# Patient Record
Sex: Female | Born: 1965 | Race: White | Hispanic: No | Marital: Married | Smoking: Former smoker
Health system: Southern US, Community
[De-identification: ages and names within clinical notes are randomized; demographics above are authoritative.]

## PROBLEM LIST (undated history)

## (undated) DIAGNOSIS — E559 Vitamin D deficiency, unspecified: Secondary | ICD-10-CM

## (undated) DIAGNOSIS — F909 Attention-deficit hyperactivity disorder, unspecified type: Secondary | ICD-10-CM

## (undated) DIAGNOSIS — M5030 Other cervical disc degeneration, unspecified cervical region: Secondary | ICD-10-CM

## (undated) DIAGNOSIS — Z87898 Personal history of other specified conditions: Secondary | ICD-10-CM

## (undated) DIAGNOSIS — G43909 Migraine, unspecified, not intractable, without status migrainosus: Secondary | ICD-10-CM

## (undated) DIAGNOSIS — S29019A Strain of muscle and tendon of unspecified wall of thorax, initial encounter: Secondary | ICD-10-CM

## (undated) DIAGNOSIS — Z8659 Personal history of other mental and behavioral disorders: Secondary | ICD-10-CM

## (undated) DIAGNOSIS — F411 Generalized anxiety disorder: Secondary | ICD-10-CM

## (undated) DIAGNOSIS — E0590 Thyrotoxicosis, unspecified without thyrotoxic crisis or storm: Secondary | ICD-10-CM

## (undated) DIAGNOSIS — M7918 Myalgia, other site: Secondary | ICD-10-CM

## (undated) HISTORY — DX: Migraine, unspecified, not intractable, without status migrainosus: G43.909

## (undated) HISTORY — DX: Myalgia, other site: M79.18

## (undated) HISTORY — PX: OTHER SURGICAL HISTORY: SHX169

## (undated) HISTORY — DX: Personal history of other mental and behavioral disorders: Z86.59

## (undated) HISTORY — DX: Generalized anxiety disorder: F41.1

## (undated) HISTORY — DX: Thyrotoxicosis, unspecified without thyrotoxic crisis or storm: E05.90

## (undated) HISTORY — DX: Vitamin D deficiency, unspecified: E55.9

## (undated) HISTORY — DX: Personal history of other specified conditions: Z87.898

## (undated) HISTORY — DX: Strain of muscle and tendon of unspecified wall of thorax, initial encounter: S29.019A

## (undated) HISTORY — DX: Attention-deficit hyperactivity disorder, unspecified type: F90.9

---
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
## 2001-06-07 ENCOUNTER — Other Ambulatory Visit: Admission: RE | Admit: 2001-06-07 | Discharge: 2001-06-07 | Payer: Self-pay | Admitting: *Deleted

## 2002-06-10 ENCOUNTER — Other Ambulatory Visit: Admission: RE | Admit: 2002-06-10 | Discharge: 2002-06-10 | Payer: Self-pay | Admitting: Obstetrics & Gynecology

## 2002-08-10 ENCOUNTER — Other Ambulatory Visit: Admission: RE | Admit: 2002-08-10 | Discharge: 2002-08-10 | Payer: Self-pay | Admitting: Obstetrics & Gynecology

## 2003-03-24 ENCOUNTER — Other Ambulatory Visit: Admission: RE | Admit: 2003-03-24 | Discharge: 2003-03-24 | Payer: Self-pay | Admitting: Obstetrics & Gynecology

## 2003-06-23 ENCOUNTER — Other Ambulatory Visit: Admission: RE | Admit: 2003-06-23 | Discharge: 2003-06-23 | Payer: Self-pay | Admitting: Obstetrics & Gynecology

## 2005-01-23 ENCOUNTER — Ambulatory Visit (HOSPITAL_COMMUNITY): Admission: RE | Admit: 2005-01-23 | Discharge: 2005-01-23 | Payer: Self-pay | Admitting: Obstetrics & Gynecology

## 2008-02-11 DIAGNOSIS — E0590 Thyrotoxicosis, unspecified without thyrotoxic crisis or storm: Secondary | ICD-10-CM

## 2008-02-11 HISTORY — DX: Thyrotoxicosis, unspecified without thyrotoxic crisis or storm: E05.90

## 2008-11-10 DIAGNOSIS — M1990 Unspecified osteoarthritis, unspecified site: Secondary | ICD-10-CM | POA: Insufficient documentation

## 2009-07-11 ENCOUNTER — Ambulatory Visit: Payer: Self-pay | Admitting: Pulmonary Disease

## 2009-07-11 DIAGNOSIS — G4710 Hypersomnia, unspecified: Secondary | ICD-10-CM | POA: Insufficient documentation

## 2009-07-11 DIAGNOSIS — G43909 Migraine, unspecified, not intractable, without status migrainosus: Secondary | ICD-10-CM | POA: Insufficient documentation

## 2010-01-30 ENCOUNTER — Ambulatory Visit (HOSPITAL_COMMUNITY)
Admission: RE | Admit: 2010-01-30 | Discharge: 2010-01-30 | Payer: Self-pay | Source: Home / Self Care | Attending: Obstetrics & Gynecology | Admitting: Obstetrics & Gynecology

## 2010-03-12 NOTE — Assessment & Plan Note (Signed)
Summary: ? sleep disorder/apc   Visit Type:  Initial Consult Copy to:  Dr. Candie Echevaria  CC:  Sleep consult. Epworth score is 12.Marland Kitchen  History of Present Illness: 45 yo female with sleep problems.  She has noticed a problem with her sleep since she was a teenager.  She has trouble staying awake during the day.  She does not have any problem falling asleep or staying asleep.  She can usually keep herself awake during the day when she is active, but can fall asleep quickly when sitting quiet.  She goes to bed at 10pm, and sleeps through the night.  She wakes up at 730am, and will occasionally get headaches in the morning.  She feels sleepy and tired alll the time.  She does not use anything to help her sleep.  She has tried using energy drinks during the day, and these help some.  She occasionally snores. 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  She will also have frequent dreams.  She will occasionally talk in her sleep.  She denies sleep walking.  She will get funny feeling in her legs about once every few weeks, but this does not usually cause too much trouble falling asleep.  Her husband has told her that she kicks her legs alot at night, and sleeps in funny positions.  She denies sleep hallucinations, sleep paralysis, or cataplexy.  She occasionally gets down with her mood, and has recently been on celexa and cymbalta.  She is not on these medications at present, and feels her mood is good.  She has started an exercise regimen, and has lost 10 lbs.  She has not noticed any difference with her sleep from her weight loss.  She denies any history of thyroid disease.  Preventive Screening-Counseling & Management  Alcohol-Tobacco     Alcohol drinks/day: <1     Smoking Status: quit     Year Quit: 2008     Pack years: 25 years x1 ppd  Current Medications (verified): 1)  Zomig 5 Mg Tabs (Zolmitriptan) .... 1/2 By Mouth As Needed For  Migraines  Allergies (verified): No Known Drug Allergies  Past History:  Past Medical History: Current Problems:  MIGRAINES, HX OF (ICD-V13.8)  Past Surgical History: no surgical history  Family History: Reviewed history and no changes required. No known significant family history  Social History: Reviewed history and no changes required. Patient states former smoker.  Married Psychologist, sport and exercise for Doctor, hospital drinks/day:  <1 Smoking Status:  quit Pack years:  25 years x1 ppd  Review of Systems  The patient denies shortness of breath with activity, shortness of breath at rest, productive cough, non-productive cough, coughing up blood, chest pain, irregular heartbeats, acid heartburn, indigestion, loss of appetite, weight change, abdominal pain, difficulty swallowing, sore throat, tooth/dental problems, headaches, nasal congestion/difficulty breathing through nose, sneezing, itching, ear ache, anxiety, depression, hand/feet swelling, joint stiffness or pain, rash, change in color of mucus, and fever.    Vital Signs:  Patient profile:   45 year old female Height:      67.5 inches (171.45 cm) Weight:      166 pounds (75.45 kg) BMI:     25.71 O2 Sat:      99 % on Room air Temp:     98.6 degrees F (37.00 degrees C) oral Pulse rate:   75 / minute BP sitting:   98 / 58  (left  arm) Cuff size:   regular  Vitals Entered By: Michel Bickers CMA (July 11, 2009 10:13 AM)  O2 Sat at Rest %:  99 O2 Flow:  Room air CC: Sleep consult. Epworth score is 12. Is Patient Diabetic? No Comments Medications reviewed. Daytime phone verified. Michel Bickers CMA  July 11, 2009 10:14 AM   Physical Exam  General:  normal appearance and healthy appearing.   Eyes:  PERRLA and EOMI.   Nose:  no deformity, discharge, inflammation, or lesions Mouth:  MP4, scalloped tongue borders, elongated uvula, retrognathic Neck:  no JVD.   Chest Wall:  no deformities noted Lungs:  clear bilaterally to  auscultation and percussion Heart:  regular rate and rhythm, S1, S2 without murmurs, rubs, gallops, or clicks Abdomen:  bowel sounds positive; abdomen soft and non-tender without masses, or organomegaly Msk:  no deformity or scoliosis noted with normal posture Pulses:  pulses normal Extremities:  no clubbing, cyanosis, edema, or deformity noted Neurologic:  CN II-XII grossly intact with normal reflexes, coordination, muscle strength and tone Cervical Nodes:  no significant adenopathy Psych:  alert and cooperative; normal mood and affect; normal attention span and concentration   Impression & Recommendations:  Problem # 1:  HYPERSOMNIA (ICD-780.54) She has symptoms and physical finding suggestive of sleep apnea.  She also has symptoms suggestive of restless leg syndrome and periodic limb movements.  Her symptoms are not very suggestive of narcolepsy.    To further evaluate sleep apnea and periodic limb movements I will arrange for an in-lab sleep test.  Depending on the results of this will determine if she needs further evaluation for restless leg syndrome.    If she does have sleep apnea, then it is likely related to her jaw structure.  In this case she may be a good candidate for an oral appliance.  Medications Added to Medication List This Visit: 1)  Zomig 5 Mg Tabs (Zolmitriptan) .... 1/2 by mouth as needed for migraines  Complete Medication List: 1)  Zomig 5 Mg Tabs (Zolmitriptan) .... 1/2 by mouth as needed for migraines  Other Orders: Consultation Level IV (16109) Sleep Disorder Referral (Sleep Disorder)  Patient Instructions: 1)  Will schedule sleep  test 2)  Will call to schedule follow up after sleep test is reviewed

## 2011-11-06 ENCOUNTER — Other Ambulatory Visit (HOSPITAL_COMMUNITY): Payer: Self-pay | Admitting: Obstetrics & Gynecology

## 2011-11-06 DIAGNOSIS — Z1231 Encounter for screening mammogram for malignant neoplasm of breast: Secondary | ICD-10-CM

## 2011-11-11 DIAGNOSIS — E559 Vitamin D deficiency, unspecified: Secondary | ICD-10-CM

## 2011-11-11 HISTORY — DX: Vitamin D deficiency, unspecified: E55.9

## 2011-11-24 ENCOUNTER — Ambulatory Visit (HOSPITAL_COMMUNITY)
Admission: RE | Admit: 2011-11-24 | Discharge: 2011-11-24 | Disposition: A | Discharge status: Home / Self Care | Payer: BC Managed Care – PPO | Source: Ambulatory Visit | Attending: Obstetrics & Gynecology | Admitting: Obstetrics & Gynecology

## 2011-11-24 DIAGNOSIS — Z1231 Encounter for screening mammogram for malignant neoplasm of breast: Secondary | ICD-10-CM

## 2012-02-11 ENCOUNTER — Emergency Department (HOSPITAL_BASED_OUTPATIENT_CLINIC_OR_DEPARTMENT_OTHER): Payer: BC Managed Care – PPO

## 2012-02-11 ENCOUNTER — Encounter (HOSPITAL_BASED_OUTPATIENT_CLINIC_OR_DEPARTMENT_OTHER): Payer: Self-pay | Admitting: *Deleted

## 2012-02-11 ENCOUNTER — Emergency Department (HOSPITAL_BASED_OUTPATIENT_CLINIC_OR_DEPARTMENT_OTHER)
Admission: EM | Admit: 2012-02-11 | Discharge: 2012-02-11 | Disposition: A | Discharge status: Home / Self Care | Payer: BC Managed Care – PPO | Attending: Emergency Medicine | Admitting: Emergency Medicine

## 2012-02-11 DIAGNOSIS — B9789 Other viral agents as the cause of diseases classified elsewhere: Secondary | ICD-10-CM | POA: Insufficient documentation

## 2012-02-11 DIAGNOSIS — B349 Viral infection, unspecified: Secondary | ICD-10-CM

## 2012-02-11 DIAGNOSIS — Z79899 Other long term (current) drug therapy: Secondary | ICD-10-CM | POA: Insufficient documentation

## 2012-02-11 MED ORDER — HYDROCOD POLST-CHLORPHEN POLST 10-8 MG/5ML PO LQCR: 5.0000 mL | Freq: Two times a day (BID) | ORAL | Status: DC

## 2012-02-11 NOTE — ED Notes (Signed)
Dry cough, body aches since Monday.

## 2012-02-11 NOTE — ED Notes (Signed)
Patient refused to undress; states it "just a cold" and she would wait until the doctor comes in to see her.

## 2012-02-11 NOTE — ED Provider Notes (Signed)
History     CSN: [id number]  Arrival date & time 02/11/12  1312   First MD Initiated Contact with Patient 02/11/12 1450      Chief Complaint  Patient presents with  . Cough    (Consider location/radiation/quality/duration/timing/severity/associated sxs/prior treatment) Patient is a 47 y.o. female presenting with cough. The history is provided by the patient. No language interpreter was used.  Cough This is a new problem. Episode onset: 3 days. The problem occurs constantly. The problem has been gradually worsening. The cough is productive of sputum. There has been no fever. Pertinent negatives include no rhinorrhea, no shortness of breath and no wheezing. She has tried nothing for the symptoms. The treatment provided no relief. She is not a smoker. Her past medical history is significant for bronchitis. Her past medical history does not include pneumonia.  Pt complains of dry hacking cough  History reviewed. No pertinent past medical history.  History reviewed. No pertinent past surgical history.  History reviewed. No pertinent family history.  History  Substance Use Topics  . Smoking status: Never Smoker   . Smokeless tobacco: Not on file  . Alcohol Use: Yes    OB History    Grav Para Term Preterm Abortions TAB SAB Ect Mult Living                  Review of Systems  HENT: Negative for rhinorrhea.   Respiratory: Positive for cough. Negative for shortness of breath and wheezing.   All other systems reviewed and are negative.    Allergies  Review of patient's allergies indicates no known allergies.  Home Medications   Current Outpatient Rx  Name  Route  Sig  Dispense  Refill  . DESVENLAFAXINE SUCCINATE ER 50 MG PO TB24   Oral   Take 50 mg by mouth daily.           BP 108/73  Pulse 87  Temp 98.4 F (36.9 C) (Oral)  Resp 18  Ht 5\' 7"  (1.702 m)  Wt 170 lb (77.111 kg)  BMI 26.63 kg/m2  SpO2 99%  LMP 01/28/2012  Physical Exam  Nursing note and vitals  reviewed. Constitutional: She is oriented to person, place, and time. She appears well-developed and well-nourished.  HENT:  Head: Normocephalic and atraumatic.  Eyes: Conjunctivae normal and EOM are normal. Pupils are equal, round, and reactive to light.  Neck: Normal range of motion.  Cardiovascular: Normal rate and normal heart sounds.   Pulmonary/Chest: Effort normal and breath sounds normal.  Abdominal: Soft.  Musculoskeletal: Normal range of motion.  Neurological: She is alert and oriented to person, place, and time.  Skin: Skin is warm.    ED Course  Procedures (including critical care time)  Labs Reviewed - No data to display Dg Chest 2 View  02/11/2012  *RADIOLOGY REPORT*  Clinical Data: Cough  CHEST - 2 VIEW  Comparison: None  Findings: The heart size and mediastinal contours are within normal limits.  Both lungs are clear.  The visualized skeletal structures are unremarkable.  IMPRESSION: No active cardiopulmonary abnormalities.   Original Report Authenticated By: Signa Kell, M.D.      No diagnosis found.    MDM  Pt given rx for tussionex  I advised tylenolif any fever,        Lonia Skinner [place], [place] 02/11/12 1604

## 2012-02-12 NOTE — ED Provider Notes (Signed)
History/physical exam/procedure(s) were performed by non-physician practitioner and as supervising physician I was immediately available for consultation/collaboration. I have reviewed all notes and am in agreement with care and plan.   Johnesha Acheampong S Maxxon Schwanke, MD 02/12/12 0723 

## 2012-06-24 ENCOUNTER — Encounter: Payer: Self-pay | Admitting: Family Medicine

## 2012-06-24 ENCOUNTER — Ambulatory Visit (INDEPENDENT_AMBULATORY_CARE_PROVIDER_SITE_OTHER): Payer: BC Managed Care – PPO | Admitting: Family Medicine

## 2012-06-24 VITALS — BP 104/74 | HR 90 | Temp 97.8°F | Resp 16 | Ht 67.25 in | Wt 174.5 lb

## 2012-06-24 DIAGNOSIS — Z87898 Personal history of other specified conditions: Secondary | ICD-10-CM

## 2012-06-24 DIAGNOSIS — F411 Generalized anxiety disorder: Secondary | ICD-10-CM | POA: Insufficient documentation

## 2012-06-24 MED ORDER — DESVENLAFAXINE SUCCINATE ER 50 MG PO TB24: 50.0000 mg | ORAL_TABLET | Freq: Every day | ORAL | Status: DC

## 2012-06-24 NOTE — Assessment & Plan Note (Signed)
Doing well on pristique 50 mg qd.  She is willing to accept the increased appetite and wt gain that has seemed to come with taking this med. RF x90d supply with 3 additional RFs today. F/u 25mo.

## 2012-06-24 NOTE — Progress Notes (Signed)
Office Note 06/24/2012  CC:  Chief Complaint  Patient presents with  . Establish Care    NP to establish.    HPI:  Heidi Leonard is a 47 y.o. White female who is here to establish care. Patient's most recent primary MD: Dr. Cyndia Bent ([facility] in [place]).  Dr. Seymour Bars is her Ob/GYN. Old records were not reviewed prior to or during today's visit.  Last pap smear 2013.  LMP 05/25/12.  Last mammogram 2013.  No acute complaints today.  Reviewed med hx.  Needs pristique RF.  Past Medical History  Diagnosis Date  . GAD (generalized anxiety disorder)   . Migraines   . History of abnormal Pap smear     Repeats are always normal per pt (has GYN MD)  . History of depression     Past Surgical History  Procedure Laterality Date  . Unremarkable.      Family History  Problem Relation Age of Onset  . Cancer Maternal Aunt   . Healthy Mother   . Other Father     Unknown Health status.  . Hypertension Sister     History   Social History  . Marital Status: Married    Spouse Name: N/A    Number of Children: N/A  . Years of Education: N/A   Occupational History  . Not on file.   Social History Main Topics  . Smoking status: Former Games developer  . Smokeless tobacco: Not on file     Comment: Quit approx [5] years ago.  . Alcohol Use: Yes  . Drug Use: No  . Sexually Active: Yes   Other Topics Concern  . Not on file   Social History Narrative   Married, 3 step children.   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     Originally from [place].   Former cig smoker, quit 2009.  Occ alcohol.  No drugs.   Works out with [employer] and walks several days a week.          Outpatient Encounter Prescriptions as of 06/24/2012  Medication Sig Dispense Refill  . desvenlafaxine (PRISTIQ) 50 MG 24 hr tablet Take 1 tablet (50 mg total) by mouth daily.  90 tablet  3  . zolmitriptan (ZOMIG) 5 MG tablet Take 5 mg by mouth as needed for migraine.      . [DISCONTINUED]  desvenlafaxine (PRISTIQ) 50 MG 24 hr tablet Take 50 mg by mouth daily.      . [DISCONTINUED] chlorpheniramine-HYDROcodone (TUSSIONEX PENNKINETIC ER) 10-8 MG/5ML LQCR Take 5 mLs by mouth every 12 (twelve) hours.  100 mL  0   No facility-administered encounter medications on file as of 06/24/2012.  Pt not taking tussionex as listed above  No Known Allergies  ROS Review of Systems  Constitutional: Negative for fever and fatigue.  HENT: Negative for congestion and sore throat.   Eyes: Negative for visual disturbance.  Respiratory: Negative for cough.   Cardiovascular: Negative for chest pain.  Gastrointestinal: Negative for nausea and abdominal pain.  Genitourinary: Negative for dysuria.  Musculoskeletal: Negative for back pain and joint swelling.  Skin: Negative for rash.  Neurological: Negative for weakness and headaches.  Hematological: Negative for adenopathy.    PE; Blood pressure 104/74, pulse 90, temperature 97.8 F (36.6 C), temperature source Oral, resp. rate 16, height 5' 7.25" (1.708 m), weight 174 lb 8 oz (79.153 kg), last menstrual period 06/03/2012, SpO2 96.00%. Gen: Alert, well appearing.  Patient is oriented to person, place, time,  and situation. ENT:  Eyes: no injection, icteris, swelling, or exudate.  EOMI, PERRLA. Nose: no drainage or turbinate edema/swelling.  No injection or focal lesion.  Mouth: lips without lesion/swelling.  Oral mucosa pink and moist.  Dentition intact and without obvious caries or gingival swelling.  Oropharynx without erythema, exudate, or swelling.  Neck - No masses or thyromegaly or limitation in range of motion CV: RRR, no m/r/g.   LUNGS: CTA bilat, nonlabored resps, good aeration in all lung fields. EXT: no clubbing, cyanosis, or edema.    Pertinent labs:  None today  ASSESSMENT AND PLAN:   New pt; obtain old records.  She reports having screening labs at her last GYN visit (last year).  GAD (generalized anxiety disorder) Doing  well on pristique 50 mg qd.  She is willing to accept the increased appetite and wt gain that has seemed to come with taking this med. RF x90d supply with 3 additional RFs today. F/u 44mo.  MIGRAINES, HX OF Stable.  Respond well to 2.5-5mg  of zomig. Not having these frequently enough to warrant prophylactic med.  An After Visit Summary was printed and given to the patient.  Return in about 6 months (around 12/25/2012) for routine f/u anxiety.

## 2012-06-24 NOTE — Assessment & Plan Note (Signed)
Stable.  Respond well to 2.5-5mg  of zomig. Not having these frequently enough to warrant prophylactic med.

## 2012-08-30 ENCOUNTER — Telehealth: Payer: Self-pay | Admitting: Family Medicine

## 2012-08-30 MED ORDER — BUTALBITAL-ACETAMINOPHEN 50-300 MG PO TABS: 50.0000 mg | ORAL_TABLET | Freq: Four times a day (QID) | ORAL | Status: DC | PRN

## 2012-08-30 NOTE — Telephone Encounter (Signed)
Patient would like to try it.  I will put samples and savings coupon up front for her to pick up.

## 2012-08-30 NOTE — Telephone Encounter (Signed)
Rx sent. Set aside 4 sample packs and a coupon.  Warn pt that most common side effect is drowsiness. -thx

## 2012-08-30 NOTE — Telephone Encounter (Signed)
May try Bupap as needed for migraine--I'll send rx and she can come by and pick up a couple of samples and a coupon if she wants.  Let me know-thx

## 2012-08-30 NOTE — Telephone Encounter (Signed)
Patient said she is getting lasik eye surgery done and she is unable to take any migraine medications with triptans in it.  Is there something else to send to pharmacy for her migraines?  Please advise.

## 2012-12-09 ENCOUNTER — Other Ambulatory Visit (HOSPITAL_COMMUNITY): Payer: Self-pay | Admitting: Obstetrics & Gynecology

## 2012-12-09 ENCOUNTER — Ambulatory Visit (INDEPENDENT_AMBULATORY_CARE_PROVIDER_SITE_OTHER): Payer: BC Managed Care – PPO | Admitting: Family Medicine

## 2012-12-09 ENCOUNTER — Encounter: Payer: Self-pay | Admitting: Family Medicine

## 2012-12-09 VITALS — BP 117/81 | HR 107 | Temp 98.6°F | Resp 18 | Ht 67.25 in | Wt 188.0 lb

## 2012-12-09 DIAGNOSIS — F411 Generalized anxiety disorder: Secondary | ICD-10-CM

## 2012-12-09 DIAGNOSIS — Z1231 Encounter for screening mammogram for malignant neoplasm of breast: Secondary | ICD-10-CM

## 2012-12-09 DIAGNOSIS — Z87898 Personal history of other specified conditions: Secondary | ICD-10-CM

## 2012-12-09 MED ORDER — VALACYCLOVIR HCL 1 G PO TABS: ORAL_TABLET | ORAL | Status: DC

## 2012-12-09 MED ORDER — ZOLMITRIPTAN 5 MG PO TABS: 5.0000 mg | ORAL_TABLET | ORAL | Status: DC | PRN

## 2012-12-09 NOTE — Progress Notes (Signed)
OFFICE NOTE  12/09/2012  CC:  Chief Complaint  Patient presents with  . Follow-up     HPI: Patient is a 47 y.o. Caucasian female who is here for 6 mo f/u anxiety/depression. Doing well on pristique regarding her mood/anxiety, but says it makes her eat "everything in sight". Walks daily but knee probs prevent more vigorous exercise. Has tried zoloft, paxil, and prozac in the past and failed or felt "too hot" on them.  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 Precipitated by stress and sometimes sun exposure.  Migraines: does well with prn use of zomig 5mg  .  Has to use this about 1 time per week.  Needs RF.   Pertinent PMH:  Past Medical History  Diagnosis Date  . GAD (generalized anxiety disorder)   . Migraines   . History of abnormal Pap smear     Repeats are always normal per pt (has GYN MD)  . History of depression    Past surgical, social, and family history reviewed and no changes noted since last office visit.  MEDS:  Not taking butalbital as listed. Outpatient Prescriptions Prior to Visit  Medication Sig Dispense Refill  . desvenlafaxine (PRISTIQ) 50 MG 24 hr tablet Take 1 tablet (50 mg total) by mouth daily.  90 tablet  3  . zolmitriptan (ZOMIG) 5 MG tablet Take 5 mg by mouth as needed for migraine.      . Butalbital-Acetaminophen 50-300 MG TABS Take 50-300 mg by mouth every 6 (six) hours as needed.  30 tablet  1   No facility-administered medications prior to visit.    PE: Blood pressure 117/81, pulse 107, temperature 98.6 F (37 C), temperature source Temporal, resp. rate 18, height 5' 7.25" (1.708 m), weight 188 lb (85.276 kg), last menstrual period 11/11/2012, SpO2 95.00%. Gen: Alert, well appearing.  Patient is oriented to person, place, time, and situation.  CV: RRR, no m/r/g.   LUNGS: CTA bilat, nonlabored resps, good aeration in all lung fields.   IMPRESSION AND PLAN:  1)  Depression/GAD--doing well on pristique and she is willing to put up with the increased appetite and wt gain right now. I agree with this as long as we see her wt plateau and also since she has failed trials of 3 other antidepressants and she has too much lethargy/sedation from benzos. She has had thyroid screens in the past that have been normal per her report: no labs in our EMR for her.  2) Recurrent herpes labialis.  Will rx valtrex for prn therapy today.  FOLLOW UP:  Pt to make appt for CPE (fasting) at her convenience in the next 6 mo. She will continue to see her GYN for cervical cancer screening and mammograms.

## 2012-12-10 ENCOUNTER — Ambulatory Visit (HOSPITAL_COMMUNITY)
Admission: RE | Admit: 2012-12-10 | Discharge: 2012-12-10 | Disposition: A | Discharge status: Home / Self Care | Payer: BC Managed Care – PPO | Source: Ambulatory Visit | Attending: Obstetrics & Gynecology | Admitting: Obstetrics & Gynecology

## 2012-12-10 DIAGNOSIS — Z1231 Encounter for screening mammogram for malignant neoplasm of breast: Secondary | ICD-10-CM

## 2012-12-24 ENCOUNTER — Ambulatory Visit: Payer: BC Managed Care – PPO | Admitting: Family Medicine

## 2013-01-25 ENCOUNTER — Encounter: Payer: Self-pay | Admitting: Family Medicine

## 2013-01-25 ENCOUNTER — Ambulatory Visit (INDEPENDENT_AMBULATORY_CARE_PROVIDER_SITE_OTHER): Payer: BC Managed Care – PPO | Admitting: Family Medicine

## 2013-01-25 VITALS — BP 111/83 | HR 87 | Temp 98.2°F | Resp 18 | Ht 67.25 in | Wt 185.0 lb

## 2013-01-25 DIAGNOSIS — Z0000 Encounter for general adult medical examination without abnormal findings: Secondary | ICD-10-CM | POA: Insufficient documentation

## 2013-01-25 LAB — CBC WITH DIFFERENTIAL/PLATELET
Basophils Relative: 0.7 % (ref 0.0–3.0)
Eosinophils Absolute: 0.2 10*3/uL (ref 0.0–0.7)
Eosinophils Relative: 3.2 % (ref 0.0–5.0)
HCT: 40.5 % (ref 36.0–46.0)
Hemoglobin: 13.8 g/dL (ref 12.0–15.0)
Lymphocytes Relative: 39 % (ref 12.0–46.0)
MCHC: 34.1 g/dL (ref 30.0–36.0)
MCV: 86.9 fl (ref 78.0–100.0)
Monocytes Relative: 7.5 % (ref 3.0–12.0)
Neutro Abs: 2.5 10*3/uL (ref 1.4–7.7)
Platelets: 244 10*3/uL (ref 150.0–400.0)
RBC: 4.66 Mil/uL (ref 3.87–5.11)
RDW: 12.8 % (ref 11.5–14.6)
WBC: 5.1 10*3/uL (ref 4.5–10.5)

## 2013-01-25 NOTE — Progress Notes (Signed)
Office Note 01/25/2013  CC:  Chief Complaint  Patient presents with  . Annual Exam    HPI:  Heidi Leonard is a 47 y.o. White female who is here for CPE. Fasting for labs today. Says GYN has recently put her on phentermine about 3 wks ago. She reports feeling well today.    Past Medical History  Diagnosis Date  . GAD (generalized anxiety disorder)   . Migraines   . History of abnormal Pap smear     Repeats are always normal per pt (has GYN MD)  . History of depression   . MIGRAINES, HX OF 07/11/2009    Qualifier: Diagnosis of  By: Excell Seltzer CMA, Lawson Fiscal    Phentermine once daily (per GYN)  Past Surgical History  Procedure Laterality Date  . Unremarkable.      Family History  Problem Relation Age of Onset  . Cancer Maternal Aunt   . Healthy Mother   . Other Father     Unknown Health status.  . Hypertension Sister     History   Social History  . Marital Status: Married    Spouse Name: N/A    Number of Children: N/A  . Years of Education: N/A   Occupational History  . Not on file.   Social History Main Topics  . Smoking status: Former Games developer  . Smokeless tobacco: Not on file     Comment: Quit approx [5] years ago.  . Alcohol Use: Yes  . Drug Use: No  . Sexual Activity: Yes   Other Topics Concern  . Not on file   Social History Narrative   Married, 3 step children.   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     Originally from [place].   Former cig smoker, quit 2009.  Occ alcohol.  No drugs.   Works out with [employer] and walks several days a week.          Outpatient Prescriptions Prior to Visit  Medication Sig Dispense Refill  . desvenlafaxine (PRISTIQ) 50 MG 24 hr tablet Take 1 tablet (50 mg total) by mouth daily.  90 tablet  3  . zolmitriptan (ZOMIG) 5 MG tablet Take 1 tablet (5 mg total) by mouth as needed for migraine.  8 tablet  6  . valACYclovir (VALTREX) 1000 MG tablet 2 tabs po q12h x 2 doses at onset of outbreak of  cold sores  20 tablet  3   No facility-administered medications prior to visit.    No Known Allergies  ROS Review of Systems  Constitutional: Negative for fever, chills, appetite change and fatigue.  HENT: Negative for congestion, dental problem, ear pain and sore throat.   Eyes: Negative for discharge, redness and visual disturbance.  Respiratory: Negative for cough, chest tightness, shortness of breath and wheezing.   Cardiovascular: Negative for chest pain, palpitations and leg swelling.  Gastrointestinal: Negative for nausea, vomiting, abdominal pain, diarrhea and blood in stool.  Genitourinary: Negative for dysuria, urgency, frequency, hematuria, flank pain and difficulty urinating.  Musculoskeletal: Negative for arthralgias, back pain, joint swelling, myalgias and neck stiffness.  Skin: Negative for pallor and rash.  Neurological: Negative for dizziness, speech difficulty, weakness and headaches.  Hematological: Negative for adenopathy. Does not bruise/bleed easily.  Psychiatric/Behavioral: Negative for confusion and sleep disturbance. The patient is not nervous/anxious.      PE; Blood pressure 111/83, pulse 87, temperature 98.2 F (36.8 C), temperature source Temporal, resp. rate 18, height 5' 7.25" (1.708 m),  weight 185 lb (83.915 kg), last menstrual period 01/25/2013, SpO2 100.00%. Gen: Alert, well appearing.  Patient is oriented to person, place, time, and situation. AFFECT: pleasant, lucid thought and speech. ENT: Ears: EACs clear, normal epithelium.  TMs with good light reflex and landmarks bilaterally.  Eyes: no injection, icteris, swelling, or exudate.  EOMI, PERRLA. Nose: no drainage or turbinate edema/swelling.  No injection or focal lesion.  Mouth: lips without lesion/swelling.  Oral mucosa pink and moist.  Dentition intact and without obvious caries or gingival swelling.  Oropharynx without erythema, exudate, or swelling.  Neck: supple/nontender.  No LAD, mass, or TM.   Carotid pulses 2+ bilaterally, without bruits. CV: RRR, no m/r/g.   LUNGS: CTA bilat, nonlabored resps, good aeration in all lung fields. ABD: soft, NT, ND, BS normal.  No hepatospenomegaly or mass.  No bruits. EXT: no clubbing, cyanosis, or edema.  Musculoskeletal: no joint swelling, erythema, warmth, or tenderness.  ROM of all joints intact. Skin - no sores or suspicious lesions or rashes or color changes  Pertinent labs:  Health panel labs drawn today: pending  ASSESSMENT AND PLAN:   Health maintenance examination Reviewed age and gender appropriate health maintenance issues (prudent diet, regular exercise, health risks of tobacco and excessive alcohol, use of seatbelts, fire alarms in home, use of sunscreen).  Also reviewed age and gender appropriate health screening as well as vaccine recommendations. Health panel labs drawn today. Discussed calcium and vit D supplementation.   An After Visit Summary was printed and given to the patient.  FOLLOW UP:  Return if symptoms worsen or fail to improve.

## 2013-01-25 NOTE — Assessment & Plan Note (Signed)
Reviewed age and gender appropriate health maintenance issues (prudent diet, regular exercise, health risks of tobacco and excessive alcohol, use of seatbelts, fire alarms in home, use of sunscreen).  Also reviewed age and gender appropriate health screening as well as vaccine recommendations. Health panel labs drawn today. Discussed calcium and vit D supplementation.

## 2013-01-25 NOTE — Progress Notes (Signed)
Pre visit review using our clinic review tool, if applicable. No additional management support is needed unless otherwise documented below in the visit note. 

## 2013-01-26 LAB — COMPREHENSIVE METABOLIC PANEL
ALT: 14 U/L (ref 0–35)
AST: 17 U/L (ref 0–37)
Albumin: 4.2 g/dL (ref 3.5–5.2)
Alkaline Phosphatase: 60 U/L (ref 39–117)
BUN: 11 mg/dL (ref 6–23)
CO2: 29 mEq/L (ref 19–32)
Calcium: 9.5 mg/dL (ref 8.4–10.5)
Chloride: 105 mEq/L (ref 96–112)
Creatinine, Ser: 0.7 mg/dL (ref 0.4–1.2)
GFR: 93.05 mL/min (ref >60.00)
Glucose, Bld: 90 mg/dL (ref 70–99)
Potassium: 5 mEq/L (ref 3.5–5.1)
Sodium: 138 mEq/L (ref 135–145)
Total Bilirubin: 0.7 mg/dL (ref 0.3–1.2)
Total Protein: 6.9 g/dL (ref 6.0–8.3)

## 2013-01-26 LAB — TSH: TSH: 1.11 u[IU]/mL (ref 0.35–5.50)

## 2013-01-26 LAB — LIPID PANEL
Cholesterol: 211 mg/dL — ABNORMAL HIGH (ref 0–200)
HDL: 47.9 mg/dL (ref >39.00)
Total CHOL/HDL Ratio: 4
Triglycerides: 73 mg/dL (ref 0.0–149.0)
VLDL: 14.6 mg/dL (ref 0.0–40.0)

## 2013-03-11 ENCOUNTER — Encounter: Payer: Self-pay | Admitting: Family Medicine

## 2013-06-20 ENCOUNTER — Encounter: Payer: Self-pay | Admitting: Family Medicine

## 2013-06-20 ENCOUNTER — Ambulatory Visit (INDEPENDENT_AMBULATORY_CARE_PROVIDER_SITE_OTHER): Payer: BC Managed Care – PPO | Admitting: Family Medicine

## 2013-06-20 VITALS — BP 131/80 | HR 96 | Temp 98.0°F | Ht 67.25 in | Wt 177.0 lb

## 2013-06-20 DIAGNOSIS — F411 Generalized anxiety disorder: Secondary | ICD-10-CM

## 2013-06-20 MED ORDER — DESVENLAFAXINE SUCCINATE ER 100 MG PO TB24: 100.0000 mg | ORAL_TABLET | Freq: Every day | ORAL | Status: DC

## 2013-06-20 NOTE — Progress Notes (Signed)
Pre visit review using our clinic review tool, if applicable. No additional management support is needed unless otherwise documented below in the visit note. 

## 2013-06-20 NOTE — Assessment & Plan Note (Signed)
Responds well to pristiq but needs dose titrated up. Will increase to 100mg  qd pristiq and she'll let me know if increased appetite side effect becomes too much for her to tolerate.

## 2013-06-20 NOTE — Progress Notes (Signed)
OFFICE NOTE  06/20/2013  CC:  Chief Complaint  Patient presents with  . Medication Problem     HPI: Patient is a 48 y.o. Caucasian female who is here for discussion of pristiq. 4-6 mo or so of feeling increase in anxiety, irritability, easily frustrated, easily angered.  No racing thoughts, no depressed mood.  Feels like her ability to feel and express emotions has returned more, which she actually like and says is a good thing--b/c pristiq initially took that away.  Denies any tendency towards easy distractibility or poor focus.   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  Pertinent PMH:  Past medical, surgical, social, and family history reviewed and no changes are noted since last office visit.  MEDS:  Outpatient Prescriptions Prior to Visit  Medication Sig Dispense Refill  . desvenlafaxine (PRISTIQ) 50 MG 24 hr tablet Take 1 tablet (50 mg total) by mouth daily.  90 tablet  3  . valACYclovir (VALTREX) 1000 MG tablet 2 tabs po q12h x 2 doses at onset of outbreak of cold sores  20 tablet  3  . zolmitriptan (ZOMIG) 5 MG tablet Take 1 tablet (5 mg total) by mouth as needed for migraine.  8 tablet  6   No facility-administered medications prior to visit.    PE: Blood pressure 131/80, pulse 96, temperature 98 F (36.7 C), temperature source Temporal, height 5' 7.25" (1.708 m), weight 177 lb (80.287 kg), SpO2 100.00%. Wt Readings from Last 2 Encounters:  06/20/13 177 lb (80.287 kg)  01/25/13 185 lb (83.915 kg)    Gen: alert, oriented x 4, affect pleasant.  Lucid thinking and conversation noted. HEENT: PERRLA, EOMI.   Neck: no LAD, mass, or thyromegaly. CV: RRR, no m/r/g LUNGS: CTA bilat, nonlabored. NEURO: no tremor or tics noted on observation.  Coordination intact. CN 2-12 grossly intact bilaterally, strength 5/5 in all extremeties.  No ataxia.   IMPRESSION AND PLAN:  GAD (generalized anxiety disorder) Responds well to pristiq  but needs dose titrated up. Will increase to 100mg  qd pristiq and she'll let me know if increased appetite side effect becomes too much for her to tolerate.    An After Visit Summary was printed and given to the patient.  FOLLOW UP: 48mo

## 2013-07-19 ENCOUNTER — Encounter: Payer: Self-pay | Admitting: Family Medicine

## 2013-07-19 ENCOUNTER — Ambulatory Visit (INDEPENDENT_AMBULATORY_CARE_PROVIDER_SITE_OTHER): Payer: BC Managed Care – PPO | Admitting: Family Medicine

## 2013-07-19 VITALS — BP 109/75 | HR 87 | Temp 98.7°F | Ht 67.25 in | Wt 182.0 lb

## 2013-07-19 DIAGNOSIS — F411 Generalized anxiety disorder: Secondary | ICD-10-CM

## 2013-07-19 DIAGNOSIS — S139XXA Sprain of joints and ligaments of unspecified parts of neck, initial encounter: Secondary | ICD-10-CM

## 2013-07-19 DIAGNOSIS — S161XXA Strain of muscle, fascia and tendon at neck level, initial encounter: Secondary | ICD-10-CM

## 2013-07-19 MED ORDER — CYCLOBENZAPRINE HCL 10 MG PO TABS: 10.0000 mg | ORAL_TABLET | Freq: Every day | ORAL | Status: DC
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
## 2013-07-19 NOTE — Progress Notes (Signed)
Pre visit review using our clinic review tool, if applicable. No additional management support is needed unless otherwise documented below in the visit note. 

## 2013-08-01 ENCOUNTER — Ambulatory Visit: Payer: BC Managed Care – PPO | Admitting: Family Medicine

## 2013-08-10 ENCOUNTER — Ambulatory Visit: Payer: BC Managed Care – PPO | Admitting: Family Medicine

## 2013-11-10 DIAGNOSIS — S29019A Strain of muscle and tendon of unspecified wall of thorax, initial encounter: Secondary | ICD-10-CM

## 2013-11-10 HISTORY — DX: Strain of muscle and tendon of unspecified wall of thorax, initial encounter: S29.019A

## 2013-11-16 ENCOUNTER — Ambulatory Visit (INDEPENDENT_AMBULATORY_CARE_PROVIDER_SITE_OTHER): Payer: BC Managed Care – PPO | Admitting: Family Medicine

## 2013-11-16 ENCOUNTER — Encounter: Payer: Self-pay | Admitting: Family Medicine

## 2013-11-16 VITALS — BP 113/71 | HR 68 | Temp 97.8°F | Ht 67.25 in | Wt 189.0 lb

## 2013-11-16 DIAGNOSIS — R0789 Other chest pain: Secondary | ICD-10-CM

## 2013-11-16 DIAGNOSIS — S29011A Strain of muscle and tendon of front wall of thorax, initial encounter: Secondary | ICD-10-CM

## 2013-11-16 DIAGNOSIS — S2341XA Sprain of ribs, initial encounter: Secondary | ICD-10-CM

## 2013-11-16 DIAGNOSIS — R0781 Pleurodynia: Secondary | ICD-10-CM

## 2013-11-16 MED ORDER — HYDROCODONE-ACETAMINOPHEN 5-325 MG PO TABS
1.0000 | ORAL_TABLET | Freq: Four times a day (QID) | ORAL | Status: DC | PRN
Start: 2013-11-16 — End: 2014-06-07

## 2013-11-16 NOTE — Progress Notes (Signed)
OFFICE NOTE  11/16/2013  CC: Right sided pain  HPI: Patient is a 48 y.o. Caucasian female who is here for 2 wk hx of right sided pain, like a "stitch" at first, has slowly crept across to front under right breast.  Certain movements of her upper body bring the pain on very easily and it is severe, sometimes takes her breath.  Sometimes dull (like when she pushes on it) and sometimes sharp.  Prior to onset of this she was doing "thrusters" when working out at [employer].  She rested completely for at least 5d and nothing has changed.   No SOB, no cough, no fever, no nausea.  Sx's are unaffected by eating.   Ibup 800 mg bid no help.  Heating pad no help.  No rash in the area.  Pertinent PMH:  Past medical, surgical, social, and family history reviewed and no changes are noted since last office visit.  MEDS:  Pristiq 100 mg qd, zomig 5mg  qd prn, ibuprofen recently as per HPI  PE: Blood pressure 113/71, pulse 68, temperature 97.8 F (36.6 C), temperature source Temporal, height 5' 7.25" (1.708 m), weight 189 lb (85.73 kg), SpO2 96.00%. Pt examined with CMA April Miller present as chaperone. Gen: Alert, well appearing.  Patient is oriented to person, place, time, and situation. CV: RRR, no m/r/g.   LUNGS: CTA bilat, nonlabored resps, good aeration in all lung fields. Right lower chest wall: she has focal severe tenderness all along the bottom rib and between the bottom two ribs in about a 4 inch area in mid-clavicular line.  No upper abd tenderness.     IMPRESSION AND PLAN:  Focal rib and intercostal MM pain and tenderness x 2 wks s/p aggressive nautilus workout. Suspect intercostal muscle strain but will r/o rib fracture with rib plain film. Will refer to Dr. Terrilee FilesZach Smith in Sports Medicine for further evaluation and management. She'll continue heat, rest, and I'll rx vicodin 5/325 to use 1-2 q6h prn (#30). Therapeutic expectations and side effect profile of medication discussed today.   Patient's questions answered.  She declined flu vaccine today.  An After Visit Summary was printed and given to the patient.  FOLLOW UP: prn

## 2013-11-16 NOTE — Progress Notes (Signed)
Pre visit review using our clinic review tool, if applicable. No additional management support is needed unless otherwise documented below in the visit note. 

## 2013-11-17 ENCOUNTER — Ambulatory Visit (HOSPITAL_BASED_OUTPATIENT_CLINIC_OR_DEPARTMENT_OTHER)
Admission: RE | Admit: 2013-11-17 | Discharge: 2013-11-17 | Disposition: A | Discharge status: Home / Self Care | Payer: BC Managed Care – PPO | Source: Ambulatory Visit | Attending: Radiology | Admitting: Radiology

## 2013-11-17 DIAGNOSIS — S29011A Strain of muscle and tendon of front wall of thorax, initial encounter: Secondary | ICD-10-CM

## 2013-11-17 DIAGNOSIS — S2341XA Sprain of ribs, initial encounter: Secondary | ICD-10-CM | POA: Insufficient documentation

## 2013-11-17 DIAGNOSIS — X58XXXA Exposure to other specified factors, initial encounter: Secondary | ICD-10-CM | POA: Insufficient documentation

## 2013-11-23 ENCOUNTER — Encounter: Payer: Self-pay | Admitting: Family Medicine

## 2013-11-23 ENCOUNTER — Ambulatory Visit (INDEPENDENT_AMBULATORY_CARE_PROVIDER_SITE_OTHER): Payer: BC Managed Care – PPO | Admitting: Family Medicine

## 2013-11-23 ENCOUNTER — Other Ambulatory Visit (INDEPENDENT_AMBULATORY_CARE_PROVIDER_SITE_OTHER): Payer: BC Managed Care – PPO

## 2013-11-23 VITALS — BP 120/80 | HR 76 | Ht 67.75 in | Wt 192.0 lb

## 2013-11-23 DIAGNOSIS — M9908 Segmental and somatic dysfunction of rib cage: Secondary | ICD-10-CM

## 2013-11-23 DIAGNOSIS — M999 Biomechanical lesion, unspecified: Secondary | ICD-10-CM | POA: Insufficient documentation

## 2013-11-23 DIAGNOSIS — R0781 Pleurodynia: Secondary | ICD-10-CM

## 2013-11-23 DIAGNOSIS — S2341XA Sprain of ribs, initial encounter: Secondary | ICD-10-CM

## 2013-11-23 DIAGNOSIS — M940 Chondrocostal junction syndrome [Tietze]: Secondary | ICD-10-CM | POA: Insufficient documentation

## 2013-11-23 DIAGNOSIS — S29011A Strain of muscle and tendon of front wall of thorax, initial encounter: Secondary | ICD-10-CM | POA: Insufficient documentation

## 2013-11-23 NOTE — Progress Notes (Signed)
  Tawana ScaleZach Vikram Tillett D.O. [facility] [address] [place], [place] [address] Phone: [phone] Subjective:    I'm seeing this patient by the request  of:  MCGOWEN,PHILIP H, MD   CC: Right side pain  NFA:[id number]:Subjective Heidi DecCynthia M Leonard is a 48 y.o. female coming in with complaint of right-sided pain. Patient states that this started approximately 3 weeks ago when she was lifting weights. States that initially right under her right breast. Patient states that certain movements especially rotation of her body to get her some discomfort. Did not notice any pain when she touches in the area. Patient states that it is allowing her to sleep comfortably. Patient states though that if she moves a certain way she can have severe pain I can even touch her breath. Patient has not been doing any lifting now for approximately 2 weeks and has not noticed any significant improvement. Patient states that it does seem to be getting better very slowly day-to-day but would like to be pain free he can start her activities again. Patient denies any significant radiation, denies any rash in the area and put the severity of pain at 6/10. Patient did have x-rays by primary care provider which were unremarkable. Patient has tried home modalities including ibuprofen and heating with no help. Patient was given some pain medication provider which does take the edge off she states.     Past medical history, social, surgical and family history all reviewed in electronic medical record.   Review of Systems: No headache, visual changes, nausea, vomiting, diarrhea, constipation, dizziness, abdominal pain, skin rash, fevers, chills, night sweats, weight loss, swollen lymph nodes, body aches, joint swelling, muscle aches, chest pain, shortness of breath, mood changes.   Objective Blood pressure 120/80, pulse 76, height 5' 7.75" (1.721 m), weight 192 lb (87.091 kg), last menstrual period 11/17/2013, SpO2 97.00%.  General: No  apparent distress alert and oriented x3 mood and affect normal, dressed appropriately.  HEENT: Pupils equal, extraocular movements intact  Respiratory: Patient's speak in full sentences and does not appear short of breath  Cardiovascular: No lower extremity edema, non tender, no erythema  Skin: Warm dry intact with no signs of infection or rash on extremities or on axial skeleton.  Abdomen: Soft nontender  Neuro: Cranial nerves II through XII are intact, neurovascularly intact in all extremities with 2+ DTRs and 2+ pulses.  Lymph: No lymphadenopathy of posterior or anterior cervical chain or axillae bilaterally.  Gait normal with good balance and coordination.  MSK:  Non tender with full range of motion and good stability and symmetric strength and tone of shoulders, elbows, wrist, hip, knee and ankles bilaterally.  Chest wall exam shows patient is very minimally tender over the T8 the ninth rib mostly on the anterior lateral aspect. There is no crepitus noted. Minimal pain anteriorly. It does appear the patient does have a slipped rib of the eighth rib on the right side.  Limited musculoskeletal ultrasound was performed by Antoine PrimasSMITH, Bao Coreas, M  Limited muscular skeletal ultrasound of the chest wall she is a patient does have increasing Doppler flow around the intercostal muscle on the anterior lateral aspect of the eighth rib. This does correspond well with the intercostal tear. Mild scar tissue formation noted.  Impression: Intercostal muscle tear  Osteopathic findings Thoracic T8 extended rotated inside that right with inhaled eighth rib   Impression and Recommendations:     This case required medical decision making of moderate complexity.

## 2013-11-23 NOTE — Assessment & Plan Note (Signed)
Decision today to treat with OMT was based on Physical Exam  After verbal consent patient was treated with HVLA, ME techniques in her thoracic and rib areas  Patient tolerated the procedure well with improvement in symptoms  Patient given exercises, stretches and lifestyle modifications  See medications in patient instructions if given  Patient will follow up in 2-3 weeks

## 2013-11-23 NOTE — Patient Instructions (Signed)
Great to meet you Ice 20 minutes 2 times daily. Usually after activity and before bed. Try pennsiad twice daily Try lifting still square with even little things Start at 50% of what you were doing.  Have fun on [address] and see me again in 3 weeks.

## 2013-11-23 NOTE — Assessment & Plan Note (Addendum)
Patient does have intercostal muscle injury but is near 3 weeks out from the initial injury. Patient should been noticing some improvement within the next 3 weeks. We discussed icing, topical anti-inflammatories and was given a trial size, and patient was given some very easy range of motion home exercises to try. Patient will take one week off and then start doing more of her regular activities at 50% weight and increasing slowly over the course of time. Patient did respond well to osteopathic manipulation today for a slipped rib is well. Patient was then come back and see me again in 3 weeks for further evaluation and treatment.  If patient is to make any significant improvement I would consider actually getting an ultrasound patient's liver but I did not palpate any enlargement today. We discussed the possibility of her keeping a food diary and seeing if there is any association as well with food intake. Patient is not responding  like a cholecystitis.

## 2013-12-14 ENCOUNTER — Ambulatory Visit (INDEPENDENT_AMBULATORY_CARE_PROVIDER_SITE_OTHER): Payer: BC Managed Care – PPO | Admitting: Family Medicine

## 2013-12-14 ENCOUNTER — Encounter: Payer: Self-pay | Admitting: Family Medicine

## 2013-12-14 ENCOUNTER — Telehealth: Payer: Self-pay | Admitting: Family Medicine

## 2013-12-14 VITALS — BP 180/70 | HR 85 | Ht 67.5 in | Wt 192.0 lb

## 2013-12-14 DIAGNOSIS — M9903 Segmental and somatic dysfunction of lumbar region: Secondary | ICD-10-CM

## 2013-12-14 DIAGNOSIS — M9902 Segmental and somatic dysfunction of thoracic region: Secondary | ICD-10-CM

## 2013-12-14 DIAGNOSIS — M940 Chondrocostal junction syndrome [Tietze]: Secondary | ICD-10-CM

## 2013-12-14 DIAGNOSIS — M9908 Segmental and somatic dysfunction of rib cage: Secondary | ICD-10-CM

## 2013-12-14 DIAGNOSIS — M999 Biomechanical lesion, unspecified: Secondary | ICD-10-CM | POA: Insufficient documentation

## 2013-12-14 MED ORDER — ZOLMITRIPTAN 5 MG PO TABS: 5.0000 mg | ORAL_TABLET | ORAL | Status: DC | PRN

## 2013-12-14 NOTE — Patient Instructions (Signed)
Good to see you Vitamin D 2000 IU daily Turmeric 500mg  twice daily Hells, butt shoulder and head touching wall for goal of 5 minutes.  Get back to the gym.  See me again in 4 weeks.

## 2013-12-14 NOTE — Assessment & Plan Note (Signed)
Decision today to treat with OMT was based on Physical Exam  After verbal consent patient was treated with HVLA, ME techniques in her thoracic, lumbar and rib areas  Patient tolerated the procedure well with improvement in symptoms  Patient given exercises, stretches and lifestyle modifications  See medications in patient instructions if given  Patient will follow up in 4 weeks

## 2013-12-14 NOTE — Assessment & Plan Note (Signed)
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 We discussed an icing regimen that I think will be beneficial. We discussed the home exercises and working on the scapular muscular strengthening that will be also helpful. I believe the patient's intercostal muscle strain is completely healed at this time. Encourage patient to start increasing her activity. I do believe the patient slipped rib syndrome may be somewhat difficult and we will actually have patient come back in 4 weeks for further evaluation and treatment.

## 2013-12-14 NOTE — Telephone Encounter (Signed)
Rf request for zomig.  Last OV was 11/16/13.  Last RF was 12/09/2012.  Please advise rf.

## 2013-12-14 NOTE — Progress Notes (Signed)
  Tawana ScaleZach Smith D.O. [facility] [address] [place], [place] [address] Phone: [phone] Subjective:     CC: Right side painfollow-up.  BJY:[id number]:Subjective Huntley DecCynthia M Leonard is a 48 y.o. female coming in with complaint of right-sided pain. Patient was seen 3 weeks ago and was found to have a slipped rib. Patient did have osteopathic manipulation and had almost complete resolution of pain at that time. Patient states since then she has been doing remarkably well, doing the home exercises, icing protocol, as well as continuing to avoid any significant heavy lifting at this point. Patient states that over the course last week she is started having some mild discomfort on that side but she knows it's secondary to things she hadn't done which was such as in proper lifting mechanics. Patient denies that it is proximally 95% better at this time.no new symptoms.     Past medical history, social, surgical and family history all reviewed in electronic medical record.   Review of Systems: No headache, visual changes, nausea, vomiting, diarrhea, constipation, dizziness, abdominal pain, skin rash, fevers, chills, night sweats, weight loss, swollen lymph nodes, body aches, joint swelling, muscle aches, chest pain, shortness of breath, mood changes.   Objective Blood pressure 180/70, pulse 85, height 5' 7.5" (1.715 m), weight 192 lb (87.091 kg), last menstrual period 11/17/2013, SpO2 97 %.  General: No apparent distress alert and oriented x3 mood and affect normal, dressed appropriately.  HEENT: Pupils equal, extraocular movements intact  Respiratory: Patient's speak in full sentences and does not appear short of breath  Cardiovascular: No lower extremity edema, non tender, no erythema  Skin: Warm dry intact with no signs of infection or rash on extremities or on axial skeleton.  Abdomen: Soft nontender  Neuro: Cranial nerves II through XII are intact, neurovascularly intact in all extremities with  2+ DTRs and 2+ pulses.  Lymph: No lymphadenopathy of posterior or anterior cervical chain or axillae bilaterally.  Gait normal with good balance and coordination.  MSK:  Non tender with full range of motion and good stability and symmetric strength and tone of shoulders, elbows, wrist, hip, knee and ankles bilaterally.  Chest wall exam shows patient is very minimally tender over the T8 the ninth rib mostly on the anterior lateral aspect. There is no crepitus noted. Minimal pain anteriorly. It does appear the patient does have a slipped rib of the eighth rib on the right side.   Osteopathic findings Thoracic T8 extended rotated inside that right with inhaled eighth rib  Lumbar L2 flexed rotated and side bent right   Impression and Recommendations:     This case required medical decision making of moderate complexity.

## 2013-12-14 NOTE — Telephone Encounter (Signed)
Zomig rx sent.

## 2013-12-15 ENCOUNTER — Other Ambulatory Visit: Payer: Self-pay | Admitting: Family Medicine

## 2013-12-15 NOTE — Telephone Encounter (Signed)
Duplicate refill request. Disregard.

## 2014-01-10 ENCOUNTER — Ambulatory Visit: Payer: BC Managed Care – PPO | Admitting: Family Medicine

## 2014-01-27 ENCOUNTER — Other Ambulatory Visit: Payer: Self-pay | Admitting: Family Medicine

## 2014-01-27 MED ORDER — DESVENLAFAXINE SUCCINATE ER 100 MG PO TB24: 100.0000 mg | ORAL_TABLET | Freq: Every day | ORAL | Status: DC

## 2014-06-07 ENCOUNTER — Encounter: Payer: Self-pay | Admitting: Family Medicine

## 2014-06-07 ENCOUNTER — Ambulatory Visit (INDEPENDENT_AMBULATORY_CARE_PROVIDER_SITE_OTHER): Payer: BLUE CROSS/BLUE SHIELD | Admitting: Family Medicine

## 2014-06-07 VITALS — BP 104/72 | HR 83 | Temp 98.1°F | Ht 67.25 in | Wt 196.0 lb

## 2014-06-07 DIAGNOSIS — R6889 Other general symptoms and signs: Secondary | ICD-10-CM

## 2014-06-07 DIAGNOSIS — F411 Generalized anxiety disorder: Secondary | ICD-10-CM

## 2014-06-07 LAB — TSH: TSH: 0.76 u[IU]/mL (ref 0.35–4.50)

## 2014-06-07 MED ORDER — BUSPIRONE HCL 15 MG PO TABS
15.0000 mg | ORAL_TABLET | Freq: Two times a day (BID) | ORAL | Status: DC
Start: 2014-06-07 — End: 2014-07-05

## 2014-06-07 MED ORDER — DESVENLAFAXINE SUCCINATE ER 100 MG PO TB24: 100.0000 mg | ORAL_TABLET | Freq: Every day | ORAL | Status: DC

## 2014-06-07 NOTE — Progress Notes (Signed)
OFFICE NOTE  06/07/2014  CC:  Chief Complaint  Patient presents with  . Follow-up   HPI: Patient is a 49 y.o. Caucasian female who is here for 6 mo f/u GAD/depression. Since I last saw her, her anxiety level still pretty high, poor focus/concentration lately.  No panic.Marland Kitchen. Not feeling irritable: she says pristiq has really helped with this.  Denies depressed mood.  Sleep is good.  Appetite is UP, and she says this is usually up when anxiety is worse. No new life situations that are causing issues lately.  Years ago she was on prozac, otherwise pristiqu is the only thing she has been on.    ROS: heat intolerance lately  Pertinent PMH:  Past medical, surgical, social, and family history reviewed and no changes are noted since last office visit.  MEDS:  Outpatient Prescriptions Prior to Visit  Medication Sig Dispense Refill  . desvenlafaxine (PRISTIQ) 100 MG 24 hr tablet Take 1 tablet (100 mg total) by mouth daily. 30 tablet 3  . zolmitriptan (ZOMIG) 5 MG tablet Take 1 tablet (5 mg total) by mouth as needed for migraine. 8 tablet 6  . HYDROcodone-acetaminophen (NORCO/VICODIN) 5-325 MG per tablet Take 1-2 tablets by mouth every 6 (six) hours as needed for moderate pain. 30 tablet 0   No facility-administered medications prior to visit.    PE: Blood pressure 104/72, pulse 83, temperature 98.1 F (36.7 C), temperature source Temporal, height 5' 7.25" (1.708 m), weight 196 lb (88.905 kg), SpO2 94 %. Wt Readings from Last 2 Encounters:  06/07/14 196 lb (88.905 kg)  12/14/13 192 lb (87.091 kg)    Gen: alert, oriented x 4, affect pleasant.  Lucid thinking and conversation noted. HEENT: PERRLA, EOMI.   Neck: no LAD, mass, or thyromegaly. CV: RRR, no m/r/g LUNGS: CTA bilat, nonlabored. NEURO: no tremor or tics noted on observation.  Coordination intact. CN 2-12 grossly intact bilaterally, strength 5/5 in all extremeties.  No ataxia.  LAB:  Lab Results  Component Value Date   WBC  5.1 01/25/2013   HGB 13.8 01/25/2013   HCT 40.5 01/25/2013   MCV 86.9 01/25/2013   PLT 244.0 01/25/2013     Chemistry      Component Value Date/Time   NA 138 01/25/2013 0922   K 5.0 01/25/2013 0922   CL 105 01/25/2013 0922   CO2 29 01/25/2013 0922   BUN 11 01/25/2013 0922   CREATININE 0.7 01/25/2013 0922      Component Value Date/Time   CALCIUM 9.5 01/25/2013 0922   ALKPHOS 60 01/25/2013 0922   AST 17 01/25/2013 0922   ALT 14 01/25/2013 0922   BILITOT 0.7 01/25/2013 0922     Lab Results  Component Value Date   TSH 1.11 01/25/2013   Lab Results  Component Value Date   CHOL 211* 01/25/2013   HDL 47.90 01/25/2013   LDLDIRECT 161.3 01/25/2013   TRIG 73.0 01/25/2013   CHOLHDL 4 01/25/2013     IMPRESSION AND PLAN: GAD: not ideal control. Continue pristiq and add buspar 15 mg bid. Keep at this dose until f/u in 1 mo, may titrate up at that time if no signs of serotonin excess. Also, check TSH today.  An After Visit Summary was printed and given to the patient.  FOLLOW UP: 1 mo

## 2014-06-07 NOTE — Progress Notes (Signed)
Pre visit review using our clinic review tool, if applicable. No additional management support is needed unless otherwise documented below in the visit note. 

## 2014-07-05 ENCOUNTER — Encounter: Payer: Self-pay | Admitting: Family Medicine

## 2014-07-05 ENCOUNTER — Ambulatory Visit (INDEPENDENT_AMBULATORY_CARE_PROVIDER_SITE_OTHER): Payer: BLUE CROSS/BLUE SHIELD | Admitting: Family Medicine

## 2014-07-05 VITALS — BP 105/71 | HR 84 | Temp 98.2°F | Resp 16 | Wt 194.0 lb

## 2014-07-05 DIAGNOSIS — F411 Generalized anxiety disorder: Secondary | ICD-10-CM | POA: Diagnosis not present

## 2014-07-05 MED ORDER — BUSPIRONE HCL 15 MG PO TABS: 15.0000 mg | ORAL_TABLET | Freq: Three times a day (TID) | ORAL | Status: DC

## 2014-07-05 NOTE — Progress Notes (Signed)
Pre visit review using our clinic review tool, if applicable. No additional management support is needed unless otherwise documented below in the visit note. 

## 2014-07-05 NOTE — Progress Notes (Signed)
OFFICE NOTE  07/05/2014  CC:  Chief Complaint  Patient presents with  . Follow-up    1 month f/u. Pt is not fasting.   HPI: Patient is a 49 y.o. Caucasian female who is here for 1 mo f/u anxiety. Last month I started her on buspar. Feeling better from a mood standpoint, slightly better from anxiety standpoint. No panic.  Still finds herself focusing on food when she feels stressed.  Wt is stable compared to last visit's wt.  No side effects from the buspar..    Pertinent PMH:  Past medical, surgical, social, and family history reviewed and no changes are noted since last office visit.  MEDS:  Outpatient Prescriptions Prior to Visit  Medication Sig Dispense Refill  . busPIRone (BUSPAR) 15 MG tablet Take 1 tablet (15 mg total) by mouth 2 (two) times daily. 60 tablet 1  . desvenlafaxine (PRISTIQ) 100 MG 24 hr tablet Take 1 tablet (100 mg total) by mouth daily. 30 tablet 6  . zolmitriptan (ZOMIG) 5 MG tablet Take 1 tablet (5 mg total) by mouth as needed for migraine. 8 tablet 6   No facility-administered medications prior to visit.    PE: Blood pressure 105/71, pulse 84, temperature 98.2 F (36.8 C), temperature source Oral, resp. rate 16, weight 194 lb (87.998 kg), SpO2 97 %. Gen: Alert, well appearing.  Patient is oriented to person, place, time, and situation. AFFECT: pleasant, lucid thought and speech. No further exam today.  LAB: Lab Results  Component Value Date   TSH 0.76 06/07/2014    IMPRESSION AND PLAN:  1) GAD: improved on buspar just a bit. Her mood is actually what has benefited most from addition of this med. Will increase dosing to 15mg  TID. Recheck 2 mo.  An After Visit Summary was printed and given to the patient.  FOLLOW UP: 2 mo

## 2014-09-01 ENCOUNTER — Ambulatory Visit: Payer: BLUE CROSS/BLUE SHIELD | Admitting: Family Medicine

## 2015-01-09 ENCOUNTER — Other Ambulatory Visit: Payer: Self-pay | Admitting: *Deleted

## 2015-01-09 MED ORDER — DESVENLAFAXINE SUCCINATE ER 100 MG PO TB24: 100.0000 mg | ORAL_TABLET | Freq: Every day | ORAL | Status: DC

## 2015-01-09 MED ORDER — ZOLMITRIPTAN 5 MG PO TABS: 5.0000 mg | ORAL_TABLET | ORAL | Status: DC | PRN

## 2015-01-09 NOTE — Telephone Encounter (Signed)
RF request for pristiq LOV: 07/05/14 Next ov: None - due for f/u Last written: 06/07/14 #30 w/ 6RF  Rx sent for #30 w/ 0RF. Pt needs ov for more refills. Left detailed message on cell vm, okay per DPR.

## 2015-01-09 NOTE — Telephone Encounter (Signed)
RF request for zolmitriptan LOV: 07/05/14 Next ov: None - due for f/u Last written: 12/14/13 #8 w/ 6RF   Rx sent for #8 w/ 1RF. Left detailed message on cell vm advising pt is due for ov and will need ov for more refills.

## 2015-01-17 ENCOUNTER — Telehealth: Payer: Self-pay | Admitting: *Deleted

## 2015-01-17 NOTE — Telephone Encounter (Signed)
I'm sorry but I can't write a letter for the reason that she has stated. Reassure her that there is no reason to suspect that the generic won't work (in fact, with these headache meds sometimes they work BETTER than the brand name), and if it doesn't work then we'll try our best at that time to get her brand name zomig.-thx

## 2015-01-17 NOTE — Telephone Encounter (Signed)
Pt LMOM on 01/17/15 at 8:20am. She stated that she needs another letter from Dr. Milinda CaveMcGowen stating that she is to take only the Brand Name Zomig, that she can not take the generic. She stated that Dr. Milinda CaveMcGowen has done a letter like this in the past. She stated that she had to renew her insurance and now they need a new letter on file. She would like to pick this letter up today if possible. Please advise. Thanks.

## 2015-01-17 NOTE — Telephone Encounter (Signed)
Spoke to pt and she stated that she has tried many migraine medications and none of them have helped. She stated that she is concerned/scared that if she tries the generic and it does not work that she will not be able to go back to the Brand Zomig.

## 2015-01-17 NOTE — Telephone Encounter (Signed)
Left detailed message on cell vm, okay per DPR.  

## 2015-01-17 NOTE — Telephone Encounter (Signed)
I don't see any letter that I've done in the past. For the purpose of the letter, ask her why she cannot take the generic zomig (side effect?  Ineffective?). No promises about getting the letter done today, but I'll try.

## 2015-01-18 NOTE — Telephone Encounter (Signed)
Pt advised and seemed unpleased with this answer.

## 2015-01-23 ENCOUNTER — Ambulatory Visit (INDEPENDENT_AMBULATORY_CARE_PROVIDER_SITE_OTHER): Payer: BLUE CROSS/BLUE SHIELD | Admitting: Family Medicine

## 2015-01-23 ENCOUNTER — Encounter: Payer: Self-pay | Admitting: Family Medicine

## 2015-01-23 VITALS — BP 104/72 | HR 84 | Temp 97.9°F | Resp 16 | Ht 67.75 in | Wt 201.5 lb

## 2015-01-23 DIAGNOSIS — F411 Generalized anxiety disorder: Secondary | ICD-10-CM

## 2015-01-23 DIAGNOSIS — G43109 Migraine with aura, not intractable, without status migrainosus: Secondary | ICD-10-CM

## 2015-01-23 DIAGNOSIS — F32A Depression, unspecified: Secondary | ICD-10-CM

## 2015-01-23 DIAGNOSIS — F329 Major depressive disorder, single episode, unspecified: Secondary | ICD-10-CM

## 2015-01-23 DIAGNOSIS — G43909 Migraine, unspecified, not intractable, without status migrainosus: Secondary | ICD-10-CM

## 2015-01-23 MED ORDER — ZOLMITRIPTAN 5 MG PO TABS: 5.0000 mg | ORAL_TABLET | ORAL | Status: DC | PRN

## 2015-01-23 MED ORDER — DESVENLAFAXINE SUCCINATE ER 100 MG PO TB24: 100.0000 mg | ORAL_TABLET | Freq: Every day | ORAL | Status: DC

## 2015-01-23 MED ORDER — CLONAZEPAM 0.25 MG PO TBDP: ORAL_TABLET | ORAL | Status: DC

## 2015-01-23 NOTE — Progress Notes (Signed)
Pre visit review using our clinic review tool, if applicable. No additional management support is needed unless otherwise documented below in the visit note. 

## 2015-01-23 NOTE — Progress Notes (Signed)
OFFICE VISIT  01/23/2015   CC:  Chief Complaint  Patient presents with  . Follow-up    Meds. Pt is not fasting.    HPI:    Patient is a 49 y.o. Caucasian female who presents for f/u migraine HA's, GAD, depression. She stopped buspar b/c not helping her anxiety.  Says pristiq has always helped her irritability but not helping her anxiety.  Seems to have chronic high level of worry, feeling keyed up, restless, concentration mildly poor. Mood fine on pristiq.  No panic attacks. Always feels in a hurry.    Migraines: happy with effect of zomig prn as abortive med.  She uses anywhere from 5 to 8 zomig tabs per month.  Past Medical History  Diagnosis Date  . GAD (generalized anxiety disorder)     with panic attacks  . Migraines   . History of abnormal Pap smear     Repeats are always normal per pt (has GYN MD)  . History of depression     Lexapro helped for a while, Pristique started 04/2011  . Subclinical hyperthyroidism 2010    Some low TSHs and normal FT4 and T3  . Vitamin D deficiency 11/2011  . Intercostal muscle tear 11/2013    R side    Past Surgical History  Procedure Laterality Date  . Unremarkable.      Outpatient Prescriptions Prior to Visit  Medication Sig Dispense Refill  . desvenlafaxine (PRISTIQ) 100 MG 24 hr tablet Take 1 tablet (100 mg total) by mouth daily. 30 tablet 0  . zolmitriptan (ZOMIG) 5 MG tablet Take 1 tablet (5 mg total) by mouth as needed for migraine. 8 tablet 1  . busPIRone (BUSPAR) 15 MG tablet Take 1 tablet (15 mg total) by mouth 3 (three) times daily. (Patient not taking: Reported on 01/23/2015) 90 tablet 2   No facility-administered medications prior to visit.    No Known Allergies  ROS As per HPI  PE: Blood pressure 104/72, pulse 84, temperature 97.9 F (36.6 C), temperature source Oral, resp. rate 16, height 5' 7.75" (1.721 m), weight 201 lb 8 oz (91.4 kg), last menstrual period 01/11/2015, SpO2 95 %. Wt Readings from Last 2  Encounters:  01/23/15 201 lb 8 oz (91.4 kg)  07/05/14 194 lb (87.998 kg)    Gen: alert, oriented x 4, affect pleasant.  Lucid thinking and conversation noted. HEENT: PERRLA, EOMI.   Neck: no LAD, mass, or thyromegaly. CV: RRR, no m/r/g LUNGS: CTA bilat, nonlabored. NEURO: no tremor or tics noted on observation.  Coordination intact. CN 2-12 grossly intact bilaterally, strength 5/5 in all extremeties.  No ataxia.  LABS:  Lab Results  Component Value Date   TSH 0.76 06/07/2014     Chemistry      Component Value Date/Time   NA 138 01/25/2013 0922   K 5.0 01/25/2013 0922   CL 105 01/25/2013 0922   CO2 29 01/25/2013 0922   BUN 11 01/25/2013 0922   CREATININE 0.7 01/25/2013 0922      Component Value Date/Time   CALCIUM 9.5 01/25/2013 0922   ALKPHOS 60 01/25/2013 0922   AST 17 01/25/2013 0922   ALT 14 01/25/2013 0922   BILITOT 0.7 01/25/2013 0922      IMPRESSION AND PLAN:  GAD, depression: anxiety not well controlled.  She does get definite mood improvement and much less irritability on pristiq so she wants to stay on this and I agree. Will add clonaz 0.25mg , 1-2 bid, #120, RF x 1.  Therapeutic expectations and side effect profile of medication discussed today.  Patient's questions answered.  Migraine HA's: she is happy with use of zomig, no preventative med.  An After Visit Summary was printed and given to the patient.   FOLLOW UP: Return in about 4 weeks (around 02/20/2015) for f/u anxiety.

## 2015-03-05 ENCOUNTER — Ambulatory Visit (INDEPENDENT_AMBULATORY_CARE_PROVIDER_SITE_OTHER): Payer: BLUE CROSS/BLUE SHIELD | Admitting: Family Medicine

## 2015-03-05 ENCOUNTER — Encounter: Payer: Self-pay | Admitting: Family Medicine

## 2015-03-05 VITALS — BP 94/60 | HR 93 | Temp 98.5°F | Resp 16 | Ht 67.75 in | Wt 202.2 lb

## 2015-03-05 DIAGNOSIS — Z79899 Other long term (current) drug therapy: Secondary | ICD-10-CM | POA: Diagnosis not present

## 2015-03-05 DIAGNOSIS — F411 Generalized anxiety disorder: Secondary | ICD-10-CM | POA: Diagnosis not present

## 2015-03-05 DIAGNOSIS — F330 Major depressive disorder, recurrent, mild: Secondary | ICD-10-CM | POA: Diagnosis not present

## 2015-03-05 MED ORDER — LITHIUM CARBONATE ER 300 MG PO TBCR: 300.0000 mg | EXTENDED_RELEASE_TABLET | Freq: Two times a day (BID) | ORAL | Status: DC

## 2015-03-05 NOTE — Progress Notes (Signed)
OFFICE VISIT  03/05/2015   CC:  Chief Complaint  Patient presents with  . Follow-up    Meds. Pt is not fasting.    HPI:    Patient is a 50 y.o. Caucasian female who presents for 6 wk f/u GAD and hx of depression. Last visit we continued her on her pristiq and added low dose clonazepam (0.25mg  tabs, 1-2 bid).  This med didn't help. Not improved: had a couple of deaths in the family since i last saw her.  Anxiety/worry is generalized/pervasive. Concentration is poor, energy level poor, motivation is down, she is very anxious and on edge a lot.  Feeling depressed mood more, sleeping more, withdrawing more.  No distinct panic attack but "almost". Xanax that she had laying around from an old rx helped recently prn but she says she rarely resorts to this b/c it does make her too sleepy.  Denies SI or HI.   Has frequent feeling of heart palpitations, occ feelings of lightheadedness--particularly orthostatic.   Past Medical History  Diagnosis Date  . GAD (generalized anxiety disorder)     with panic attacks  . Migraines   . History of abnormal Pap smear     Repeats are always normal per pt (has GYN MD)  . History of depression     Lexapro helped for a while, Pristique started 04/2011  . Subclinical hyperthyroidism 2010    Some low TSHs and normal FT4 and T3  . Vitamin D deficiency 11/2011  . Intercostal muscle tear 11/2013    R side    Past Surgical History  Procedure Laterality Date  . Unremarkable.      Outpatient Prescriptions Prior to Visit  Medication Sig Dispense Refill  . desvenlafaxine (PRISTIQ) 100 MG 24 hr tablet Take 1 tablet (100 mg total) by mouth daily. 30 tablet 12  . zolmitriptan (ZOMIG) 5 MG tablet Take 1 tablet (5 mg total) by mouth as needed for migraine. 8 tablet 12  . clonazePAM (KLONOPIN) 0.25 MG disintegrating tablet 1-2 tabs po bid prn (Patient not taking: Reported on 03/05/2015) 120 tablet 1   No facility-administered medications prior to visit.    No  Known Allergies  ROS As per HPI  PE: Blood pressure 94/60, pulse 93, temperature 98.5 F (36.9 C), temperature source Oral, resp. rate 16, height 5' 7.75" (1.721 m), weight 202 lb 4 oz (91.74 kg), last menstrual period 02/09/2015, SpO2 93 %. Gen: Alert, well appearing.  Patient is oriented to person, place, time, and situation. CV: RRR, no m/r/g.   LUNGS: CTA bilat, nonlabored resps, good aeration in all lung fields. AFFECT: pleasant, lucid thought and speech.  LABS:  Lab Results  Component Value Date   TSH 0.76 06/07/2014     Chemistry      Component Value Date/Time   NA 138 01/25/2013 0922   K 5.0 01/25/2013 0922   CL 105 01/25/2013 0922   CO2 29 01/25/2013 0922   BUN 11 01/25/2013 0922   CREATININE 0.7 01/25/2013 0922      Component Value Date/Time   CALCIUM 9.5 01/25/2013 0922   ALKPHOS 60 01/25/2013 0922   AST 17 01/25/2013 0922   ALT 14 01/25/2013 0922   BILITOT 0.7 01/25/2013 0922     IMPRESSION AND PLAN:  Poorly controlled GAD and now relapsing into depression again. Again, she feels like she has gotten good mood/irritability relief from pristiq so we are doing all we can to keep this on board while still trying to help  improve her anxiety.   Now that her mood is faltering again--along with the uncontrolled anxiety---I will initiate a trial of lithium ER 300 mg bid.  Therapeutic expectations and side effect profile of medication discussed today.  Patient's questions answered. Check CMET and TSH today. In 2 wks she'll return for lithium trough. She will stay on her pristiq 100 mg qd.  An After Visit Summary was printed and given to the patient.  Spent 25 min with pt today, with >50% of this time spent in counseling and care coordination regarding the above problems.  FOLLOW UP: Return in about 4 weeks (around 04/02/2015) for f/u anxiety/mood/meds.

## 2015-03-05 NOTE — Progress Notes (Signed)
Pre visit review using our clinic review tool, if applicable. No additional management support is needed unless otherwise documented below in the visit note. 

## 2015-03-06 LAB — COMPREHENSIVE METABOLIC PANEL
ALBUMIN: 4.2 g/dL (ref 3.5–5.2)
ALT: 24 U/L (ref 0–35)
AST: 19 U/L (ref 0–37)
Alkaline Phosphatase: 78 U/L (ref 39–117)
BUN: 12 mg/dL (ref 6–23)
CHLORIDE: 102 meq/L (ref 96–112)
CO2: 27 meq/L (ref 19–32)
CREATININE: 0.74 mg/dL (ref 0.40–1.20)
Calcium: 9.3 mg/dL (ref 8.4–10.5)
GFR: 89.32 mL/min (ref >60.00)
GLUCOSE: 94 mg/dL (ref 70–99)
POTASSIUM: 4.6 meq/L (ref 3.5–5.1)
Sodium: 136 mEq/L (ref 135–145)
Total Bilirubin: 0.7 mg/dL (ref 0.2–1.2)
Total Protein: 7 g/dL (ref 6.0–8.3)

## 2015-03-06 LAB — TSH: TSH: 1.02 u[IU]/mL (ref 0.35–4.50)

## 2015-03-20 ENCOUNTER — Other Ambulatory Visit (INDEPENDENT_AMBULATORY_CARE_PROVIDER_SITE_OTHER): Payer: BLUE CROSS/BLUE SHIELD

## 2015-03-20 ENCOUNTER — Telehealth: Payer: Self-pay | Admitting: Family Medicine

## 2015-03-20 DIAGNOSIS — Z79899 Other long term (current) drug therapy: Secondary | ICD-10-CM | POA: Diagnosis not present

## 2015-03-20 NOTE — Telephone Encounter (Signed)
Great!

## 2015-03-20 NOTE — Telephone Encounter (Signed)
Patient came into office today for lab draw.  She stated that she does see a difference with the new medication.  Patient seemed pleased.

## 2015-03-21 LAB — LITHIUM LEVEL: Lithium Lvl: 0.4 mEq/L — ABNORMAL LOW (ref 0.80–1.40)

## 2015-04-06 ENCOUNTER — Encounter: Payer: Self-pay | Admitting: Family Medicine

## 2015-04-06 ENCOUNTER — Ambulatory Visit (INDEPENDENT_AMBULATORY_CARE_PROVIDER_SITE_OTHER): Payer: BLUE CROSS/BLUE SHIELD | Admitting: Family Medicine

## 2015-04-06 VITALS — BP 103/74 | HR 89 | Temp 98.0°F | Resp 16 | Ht 67.75 in | Wt 206.0 lb

## 2015-04-06 DIAGNOSIS — Z23 Encounter for immunization: Secondary | ICD-10-CM | POA: Diagnosis not present

## 2015-04-06 DIAGNOSIS — F410 Panic disorder [episodic paroxysmal anxiety] without agoraphobia: Secondary | ICD-10-CM

## 2015-04-06 DIAGNOSIS — F411 Generalized anxiety disorder: Secondary | ICD-10-CM

## 2015-04-06 DIAGNOSIS — F32A Depression, unspecified: Secondary | ICD-10-CM

## 2015-04-06 DIAGNOSIS — F329 Major depressive disorder, single episode, unspecified: Secondary | ICD-10-CM | POA: Diagnosis not present

## 2015-04-06 NOTE — Progress Notes (Signed)
OFFICE VISIT  04/06/2015   CC:  Chief Complaint  Patient presents with  . Follow-up    anxiety/mood/meds   HPI:    Patient is a 50 y.o. Caucasian female who presents for 1 month f/u anxiety and depression. Last visit I added lithium ER 300 mg bid.  She was on this for almost a month but couldn't tolerate it secondary to feeling oversedated.   She wants to stick with just desvenlafaxine 100 mg qd for now.  Past Medical History  Diagnosis Date  . GAD (generalized anxiety disorder)     with panic attacks  . Migraines   . History of abnormal Pap smear     Repeats are always normal per pt (has GYN MD)  . History of depression     Lexapro helped for a while, Pristique started 04/2011  . Subclinical hyperthyroidism 2010    Some low TSHs and normal FT4 and T3  . Vitamin D deficiency 11/2011  . Intercostal muscle tear 11/2013    R side    Past Surgical History  Procedure Laterality Date  . Unremarkable.      Outpatient Prescriptions Prior to Visit  Medication Sig Dispense Refill  . desvenlafaxine (PRISTIQ) 100 MG 24 hr tablet Take 1 tablet (100 mg total) by mouth daily. 30 tablet 12  . zolmitriptan (ZOMIG) 5 MG tablet Take 1 tablet (5 mg total) by mouth as needed for migraine. 8 tablet 12  . lithium carbonate (LITHOBID) 300 MG CR tablet Take 1 tablet (300 mg total) by mouth 2 (two) times daily. (Patient not taking: Reported on 04/06/2015) 60 tablet 1   No facility-administered medications prior to visit.    No Known Allergies  ROS As per HPI  PE: Blood pressure 103/74, pulse 89, temperature 98 F (36.7 C), temperature source Oral, resp. rate 16, height 5' 7.75" (1.721 m), weight 206 lb (93.441 kg), last menstrual period 03/08/2015, SpO2 95 %. Gen: Alert, well appearing.  Patient is oriented to person, place, time, and situation. AFFECT: pleasant, lucid thought and speech. No further exam today.  LABS:  Lab Results  Component Value Date   LITHIUM 0.40* 03/20/2015   NA 136 03/05/2015   BUN 12 03/05/2015   CREATININE 0.74 03/05/2015   TSH 1.02 03/05/2015   WBC 5.1 01/25/2013   IMPRESSION AND PLAN:  1) GAD with panic, also with depression. She seemed to have responded well to lithium and I am concerned that she is going to go back into significant anxiety/panic/depression in the next 1-2 weeks since d/c of lithium.  However, she could not tolerate sedative effect of the med.  Per her preference, we'll continue with pristiq 100 mg qd at this time. Consider trial of gabapentin in future if significant sx's return. Discussed referral to psychiatrist and/or counselor today and she declined both.  2) Prev health: Tdap today.  An After Visit Summary was printed and given to the patient.  FOLLOW UP: Return if symptoms worsen or fail to improve.

## 2015-04-06 NOTE — Progress Notes (Signed)
Pre visit review using our clinic review tool, if applicable. No additional management support is needed unless otherwise documented below in the visit note. 

## 2015-04-27 ENCOUNTER — Other Ambulatory Visit: Payer: Self-pay | Admitting: *Deleted

## 2015-04-27 MED ORDER — DESVENLAFAXINE SUCCINATE ER 100 MG PO TB24: 100.0000 mg | ORAL_TABLET | Freq: Every day | ORAL | Status: DC

## 2015-04-27 NOTE — Telephone Encounter (Signed)
Pt called and stated that pharmacy filled her Pristiq for the generic and she wants to brand. She stated that they will need a new Rx sent over stating Brand only.   RF request for Pristiq LOV: 04/06/15 Next ov: None Last written: 01/23/16 #30 w/ 12RF  Rx sent for Brand only. Pt advised and voiced understanding.    Spoke with John at [facility] and confirm that they received Rx and that they could fill Rx for Brand.

## 2015-05-31 ENCOUNTER — Telehealth: Payer: Self-pay | Admitting: Family Medicine

## 2015-05-31 NOTE — Telephone Encounter (Signed)
Noted  

## 2015-05-31 NOTE — Telephone Encounter (Signed)
[facility] - Client TELEPHONE ADVICE RECORD [facility] Patient Name: Larene PickettCYNTHIA Stepp DOB: 09/07/1965 Initial Comment Caller states been exposed to shingles Nurse Assessment Nurse: Debera Latalston, RN, Tinnie GensJeffrey Date/Time Lamount Cohen(Eastern Time): 05/31/2015 4:33:58 PM Confirm and document reason for call. If symptomatic, describe symptoms. You must click the next button to save text entered. ---Caller states been exposed to shingles. Lady who cleans her home was diagnosed with them. She has had no direct contact with her. Has the patient traveled out of the country within the last 30 days? ---No Does the patient have any new or worsening symptoms? ---No Guidelines Guideline Title Affirmed Question Affirmed Notes Final Disposition User Comments Transmission The virus is spread through direct contact with fluid from the rash blisters caused by shingles. A person with active shingles can spread the virus when the rash is in the blister-phase. A person is not infectious before the blisters appear. Once the rash has developed crusts, the person is no longer contagious. Shingles is less contagious than chickenpox and the risk of a person with shingles spreading the virus is low if the rash is covered Per CDC.gov

## 2015-05-31 NOTE — Telephone Encounter (Signed)
FYI

## 2015-10-04 ENCOUNTER — Telehealth: Payer: Self-pay | Admitting: *Deleted

## 2015-10-04 MED ORDER — ALPRAZOLAM 1 MG PO TABS: 1.0000 mg | ORAL_TABLET | Freq: Two times a day (BID) | ORAL | 3 refills | Status: DC | PRN

## 2015-10-04 NOTE — Telephone Encounter (Signed)
Alprazolam 1mg  rx printed. F/u office visit needed in 3-4 mo.-thx

## 2015-10-04 NOTE — Telephone Encounter (Signed)
Pt called stating that she has took alprazolam in the past for her anxiety. She stated that her anxiety has been up and the pristiq is not helping much. She is requesting refill for alprazolam. She wants to know if Dr. Milinda CaveMcGowen will fill it for the 1mg  her previous Rx was for 0.5mg . LOV: 04/06/15 [facility]

## 2015-10-05 NOTE — Telephone Encounter (Signed)
Rx faxed. Left detailed message on cell vm, okay per DPR.  

## 2016-01-01 ENCOUNTER — Other Ambulatory Visit: Payer: Self-pay

## 2016-01-01 MED ORDER — ZOLMITRIPTAN 5 MG PO TABS: 5.0000 mg | ORAL_TABLET | ORAL | 12 refills | Status: DC | PRN

## 2016-01-01 NOTE — Telephone Encounter (Signed)
Patient requesting refill on medication.

## 2016-01-07 ENCOUNTER — Encounter: Payer: Self-pay | Admitting: Family Medicine

## 2016-01-07 ENCOUNTER — Ambulatory Visit (INDEPENDENT_AMBULATORY_CARE_PROVIDER_SITE_OTHER): Payer: BLUE CROSS/BLUE SHIELD | Admitting: Family Medicine

## 2016-01-07 VITALS — BP 102/70 | HR 87 | Temp 97.8°F | Resp 20 | Wt 200.5 lb

## 2016-01-07 DIAGNOSIS — B009 Herpesviral infection, unspecified: Secondary | ICD-10-CM

## 2016-01-07 DIAGNOSIS — H1031 Unspecified acute conjunctivitis, right eye: Secondary | ICD-10-CM

## 2016-01-07 MED ORDER — OFLOXACIN 0.3 % OP SOLN: 2.0000 [drp] | Freq: Four times a day (QID) | OPHTHALMIC | 0 refills | Status: AC

## 2016-01-07 MED ORDER — VALACYCLOVIR HCL 1 G PO TABS: ORAL_TABLET | ORAL | 0 refills | Status: DC

## 2016-01-07 NOTE — Patient Instructions (Signed)
I have called in eye drops for you to use 6-7 days in your right eye.  Keep eye clean, never use same wash cloth. Do not use contacts until drops completed. Do not use same contact as with infection.    Wash your hands!!   Bacterial Conjunctivitis Bacterial conjunctivitis is an infection of the clear membrane that covers the white part of your eye and the inner surface of your eyelid (conjunctiva). When the blood vessels in your conjunctiva become inflamed, your eye becomes red or pink, and it will probably feel itchy. Bacterial conjunctivitis spreads very easily from person to person (is contagious). It also spreads easily from one eye to the other eye. What are the causes? This condition is caused by several common bacteria. You may get the infection if you come into close contact with another person who is infected. You may also come into contact with items that are contaminated with the bacteria, such as a face towel, contact lens solution, or eye makeup. What increases the risk? This condition is more likely to develop in people who:  Are exposed to other people who have the infection.  Wear contact lenses.  Have a sinus infection.  Have had a recent eye injury or surgery.  Have a weak body defense system (immune system).  Have a medical condition that causes dry eyes. What are the signs or symptoms? Symptoms of this condition include:  Eye redness.  Tearing or watery eyes.  Itchy eyes.  Burning feeling in your eyes.  Thick, yellowish discharge from an eye. This may turn into a crust on the eyelid overnight and cause your eyelids to stick together.  Swollen eyelids.  Blurred vision. How is this diagnosed? Your health care provider can diagnose this condition based on your symptoms and medical history. Your health care provider may also take a sample of discharge from your eye to find the cause of your infection. This is rarely done. How is this treated? Treatment for  this condition includes:  Antibiotic eye drops or ointment to clear the infection more quickly and prevent the spread of infection to others.  Oral antibiotic medicines to treat infections that do not respond to drops or ointments, or last longer than 10 days.  Cool, wet cloths (cool compresses) placed on the eyes.  Artificial tears applied 2-6 times a day. Follow these instructions at home: Medicines  Take or apply your antibiotic medicine as told by your health care provider. Do not stop taking or applying the antibiotic even if you start to feel better.  Take or apply over-the-counter and prescription medicines only as told by your health care provider.  Be very careful to avoid touching the edge of your eyelid with the eye drop bottle or the ointment tube when you apply medicines to the affected eye. This will keep you from spreading the infection to your other eye or to other people. Managing discomfort  Gently wipe away any drainage from your eye with a warm, wet washcloth or a cotton ball.  Apply a cool, clean washcloth to your eye for 10-20 minutes, 3-4 times a day. General instructions  Do not wear contact lenses until the inflammation is gone and your health care provider says it is safe to wear them again. Ask your health care provider how to sterilize or replace your contact lenses before you use them again. Wear glasses until you can resume wearing contacts.  Avoid wearing eye makeup until the inflammation is gone. Throw away any  old eye cosmetics that may be contaminated.  Change or wash your pillowcase every day.  Do not share towels or washcloths. This may spread the infection.  Wash your hands often with soap and water. Use paper towels to dry your hands.  Avoid touching or rubbing your eyes.  Do not drive or use heavy machinery if your vision is blurred. Contact a health care provider if:  You have a fever.  Your symptoms do not get better after 10  days. Get help right away if:  You have a fever and your symptoms suddenly get worse.  You have severe pain when you move your eye.  You have facial pain, redness, or swelling.  You have sudden loss of vision. This information is not intended to replace advice given to you by your health care provider. Make sure you discuss any questions you have with your health care provider. Document Released: 01/27/2005 Document Revised: 06/07/2015 Document Reviewed: 11/09/2014 Elsevier Interactive Patient Education  2017 [employer]

## 2016-01-07 NOTE — Progress Notes (Signed)
Heidi Leonard , 02/28/1965, 50 y.o., female MRN: [id number] Patient Care Team    Relationship Specialty Notifications Start End  Jeoffrey MassedPhilip H McGowen, MD PCP - General Family Medicine  06/24/12   Judi SaaZachary M Smith, DO Consulting Physician Sports Medicine  11/23/13     CC: right eye redness Subjective: Pt presents for an acute OV with complaints of right eye redness of 1 day duration. She is a contact lens wearer in the red eye. She reports the eye is starting to feel "dry" over the last few hours. She has had pink eye in the past and this feels similar. She has thrown out the contact from yesterday. She does wear her contacts for 30 days straight. She denies trauma or scratches to her eye. She denies light sensitivity, headache, blurred vision, eye drainage, fever, chills, nausea, vomit or allergy or URI symptoms.   No Known Allergies Social History  Substance Use Topics  . Smoking status: Former Games developermoker  . Smokeless tobacco: Never Used     Comment: Quit approx [5] years ago.  . Alcohol use Yes   Past Medical History:  Diagnosis Date  . GAD (generalized anxiety disorder)    with panic attacks  . History of abnormal Pap smear    Repeats are always normal per pt (has GYN MD)  . History of depression    Lexapro helped for a while, Pristique started 04/2011  . Intercostal muscle tear 11/2013   R side  . Migraines   . Subclinical hyperthyroidism 2010   Some low TSHs and normal FT4 and T3  . Vitamin D deficiency 11/2011   Past Surgical History:  Procedure Laterality Date  . Unremarkable.     Family History  Problem Relation Age of Onset  . Cancer Maternal Aunt   . Healthy Mother   . Other Father     Unknown Health status.  . Hypertension Sister      Medication List       Accurate as of 01/07/16  1:31 PM. Always use your most recent med list.          ALPRAZolam 1 MG tablet Commonly known as:  XANAX Take 1 tablet (1 mg total) by mouth 2 (two) times daily as needed for  anxiety.   desvenlafaxine 100 MG 24 hr tablet Commonly known as:  PRISTIQ Take 1 tablet (100 mg total) by mouth daily.   zolmitriptan 5 MG tablet Commonly known as:  ZOMIG Take 1 tablet (5 mg total) by mouth as needed for migraine.       No results found for this or any previous visit (from the past 24 hour(s)). No results found.   ROS: Negative, with the exception of above mentioned in HPI   Objective:  BP 102/70 (BP Location: Left Arm, Patient Position: Sitting, Cuff Size: Normal)   Pulse 87   Temp 97.8 F (36.6 C)   Resp 20   Wt 200 lb 8 oz (90.9 kg)   SpO2 98%   BMI 30.71 kg/m  Body mass index is 30.71 kg/m. Gen: Afebrile. No acute distress. Nontoxic in appearance, well developed, well nourished.  HENT: AT. . Bilateral TM visualized WNL. MMM, no oral lesions. Bilateral nares without erythema. Throat without erythema or exudates. No cough or hoarseness.  Eyes:Pupils Equal Round Reactive to light, Extraocular movements intact,  Conjunctiva with injections/blood shot right eye only. No drainage. No light sensitivity.   Neck/lymp/endocrine: Supple,no lymphadenopathy Chest: CTAB, no wheeze or crackles. Good air  movement, normal resp effort.  Neuro:  Normal gait. PERLA. EOMi. Alert. Oriented x3   Assessment/Plan: Heidi Leonard is a 50 y.o. female present for acute OV for  Acute bacterial conjunctivitis of right eye - discussed contact wearing precautions and abx drop tx.  - Keep area cleansed with clean cloth a few times a day.  - avoid contact use until 7 day treatment completed.  - ofloxacin (OCUFLOX) 0.3 % ophthalmic solution; Place 2 drops into the right eye 4 (four) times daily.  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 She reports this time of year is her worse. Will refill x1. Future refills through PCP.  - valACYclovir (VALTREX) 1000 MG tablet; 2 tabs po q12h x 2 doses at onset of outbreak of cold  sores  Dispense: 20 tablet; Refill: 0    > 25 minutes spent with patient, >50% of time spent face to face   electronically signed by:  Felix Pacinienee Bryana Froemming, DO  [facility]

## 2016-01-09 ENCOUNTER — Telehealth: Payer: Self-pay | Admitting: *Deleted

## 2016-01-09 NOTE — Telephone Encounter (Signed)
Pt was seen by Dr. Claiborne BillingsKuneff on 01/07/16. During that visit pt had requested a PA for brand name (Mobic?). This medication is not on her list. Left message for pt to call back.

## 2016-01-11 NOTE — Telephone Encounter (Signed)
Spoke to pt she stated that for the past two years Dr. Milinda CaveMcGowen has provided her with a letter to send to her insurance company stating that she needs the brand Zomig. I reviewed pts chart to see if I could find a copy of said letter. I was unable to find a "letter" or documentation of a PA being done. I did find a telephone note (01/17/15) were pt requested this and the request was denied. Please advise. Thanks.

## 2016-01-11 NOTE — Telephone Encounter (Signed)
Sorry, no letter. The patient can take generic Zomig.-thx

## 2016-01-11 NOTE — Telephone Encounter (Signed)
Spoke with Marchelle FolksAmanda, she requested this be forwarded to her.

## 2016-01-14 NOTE — Telephone Encounter (Signed)
Contacted patient and informed her that generic Zomig  Is ok to take. Advised that a letter would not be written for name brand but that if the generic medication did not work to certainly let us know. She voiced understanding.

## 2016-01-16 DIAGNOSIS — H524 Presbyopia: Secondary | ICD-10-CM | POA: Diagnosis not present

## 2016-02-21 DIAGNOSIS — Z01419 Encounter for gynecological examination (general) (routine) without abnormal findings: Secondary | ICD-10-CM | POA: Diagnosis not present

## 2016-02-21 DIAGNOSIS — Z6838 Body mass index (BMI) 38.0-38.9, adult: Secondary | ICD-10-CM | POA: Diagnosis not present

## 2016-02-21 DIAGNOSIS — Z1231 Encounter for screening mammogram for malignant neoplasm of breast: Secondary | ICD-10-CM | POA: Diagnosis not present

## 2016-02-21 LAB — HM PAP SMEAR: HM Pap smear: NORMAL

## 2016-05-12 ENCOUNTER — Telehealth: Payer: Self-pay | Admitting: *Deleted

## 2016-05-12 MED ORDER — DESVENLAFAXINE SUCCINATE ER 100 MG PO TB24: 100.0000 mg | ORAL_TABLET | Freq: Every day | ORAL | 0 refills | Status: DC

## 2016-05-12 NOTE — Telephone Encounter (Signed)
[facility].  RF request for prestiq LOV: 04/06/15 Next ov: None Last written: 04/27/15 #30 w/ 12RF  Will send Rx for #30 w/ 0RF. Pt is due for f/u RCI, needs office visit for more refills.

## 2016-05-14 ENCOUNTER — Ambulatory Visit: Payer: BLUE CROSS/BLUE SHIELD | Admitting: Family Medicine

## 2016-05-26 NOTE — Telephone Encounter (Signed)
Left detailed message on cell vm, okay per DPR.  

## 2016-06-10 ENCOUNTER — Encounter: Payer: Self-pay | Admitting: Family Medicine

## 2016-06-10 ENCOUNTER — Ambulatory Visit (INDEPENDENT_AMBULATORY_CARE_PROVIDER_SITE_OTHER): Payer: BLUE CROSS/BLUE SHIELD | Admitting: Family Medicine

## 2016-06-10 VITALS — BP 107/73 | HR 82 | Temp 97.8°F | Resp 16 | Wt 204.0 lb

## 2016-06-10 DIAGNOSIS — F339 Major depressive disorder, recurrent, unspecified: Secondary | ICD-10-CM

## 2016-06-10 DIAGNOSIS — F410 Panic disorder [episodic paroxysmal anxiety] without agoraphobia: Secondary | ICD-10-CM

## 2016-06-10 DIAGNOSIS — F411 Generalized anxiety disorder: Secondary | ICD-10-CM

## 2016-06-10 MED ORDER — GABAPENTIN 100 MG PO CAPS: ORAL_CAPSULE | ORAL | 1 refills | Status: DC

## 2016-06-10 MED ORDER — DESVENLAFAXINE SUCCINATE ER 100 MG PO TB24: 100.0000 mg | ORAL_TABLET | Freq: Every day | ORAL | 3 refills | Status: DC

## 2016-06-10 NOTE — Patient Instructions (Signed)
Gabapentin: take 1 tab three times per day for 3 weeks, then increase to 2 tabs three times per day.

## 2016-06-10 NOTE — Progress Notes (Signed)
Pre visit review using our clinic review tool, if applicable. No additional management support is needed unless otherwise documented below in the visit note. 

## 2016-06-10 NOTE — Progress Notes (Signed)
OFFICE VISIT  06/10/2016   CC:  Chief Complaint  Patient presents with  . Anxiety    follow up    HPI:    Patient is a 51 y.o. Caucasian female who presents for f/u anxiety and depression. I last saw her 14 mo ago, at which time she wanted to continue on just pristiq after having tried lithium for a brief time. Sedative effects of lithium were too much for her, despite feeling like it was helping some. She is also currently taking alprazolam   prn, which is "once in a blue moon".  Feels like mood is stable.  Satisfied with response to pristiq from mood standpoint. Anxiety is still up, maybe a little worse since last visit.  Worries all the time about everything.  Feels fidgety all the time, can't concentrate.  Easily overwhelmed.  Had one extensive period (2-3d) in the last few months of feeling on the brink of panic, then this spontaneously resolved. She multitasks very well, not easily frustrated with difficult task, doesn't avoid tasks that may take a long time or require lots of focus.   Energy level chronically down--moderate intensity.  Feels sleepy most of her day, feels like it is from being chronically over-anxious.  Past Medical History:  Diagnosis Date  . GAD (generalized anxiety disorder)    with panic attacks  . History of abnormal Pap smear    Repeats are always normal per pt (has GYN MD)  . History of depression    Lexapro helped for a while, Pristique started 04/2011  . Intercostal muscle tear 11/2013   R side  . Migraines   . Subclinical hyperthyroidism 2010   Some low TSHs and normal FT4 and T3  . Vitamin D deficiency 11/2011    Past Surgical History:  Procedure Laterality Date  . Unremarkable.      Outpatient Medications Prior to Visit  Medication Sig Dispense Refill  . ALPRAZolam (XANAX) 1 MG tablet Take 1 tablet (1 mg total) by mouth 2 (two) times daily as needed for anxiety. 60 tablet 3  . zolmitriptan (ZOMIG) 5 MG tablet Take 1 tablet (5 mg  total) by mouth as needed for migraine. 8 tablet 12  . desvenlafaxine (PRISTIQ) 100 MG 24 hr tablet Take 1 tablet (100 mg total) by mouth daily. 30 tablet 0  . valACYclovir (VALTREX) 1000 MG tablet 2 tabs po q12h x 2 doses at onset of outbreak of cold sores (Patient not taking: Reported on 06/10/2016) 20 tablet 0   No facility-administered medications prior to visit.     No Known Allergies  ROS As per HPI  PE: Blood pressure 107/73, pulse 82, temperature 97.8 F (36.6 C), temperature source Oral, resp. rate 16, weight 204 lb (92.5 kg), last menstrual period 05/26/2016, SpO2 95 %. Wt Readings from Last 2 Encounters:  06/10/16 204 lb (92.5 kg)  01/07/16 200 lb 8 oz (90.9 kg)    Gen: alert, oriented x 4, affect pleasant.  Lucid thinking and conversation noted. HEENT: PERRLA, EOMI.   Neck: no LAD, mass, or thyromegaly. CV: RRR, no m/r/g LUNGS: CTA bilat, nonlabored. NEURO: no tremor or tics noted on observation.  Coordination intact. CN 2-12 grossly intact bilaterally, strength 5/5 in all extremeties.  No ataxia.  LABS:  Lab Results  Component Value Date   TSH 1.02 03/05/2015     Chemistry      Component Value Date/Time   NA 136 03/05/2015 1549   K 4.6 03/05/2015 1549   CL  102 03/05/2015 1549   CO2 27 03/05/2015 1549   BUN 12 03/05/2015 1549   CREATININE 0.74 03/05/2015 1549      Component Value Date/Time   CALCIUM 9.3 03/05/2015 1549   ALKPHOS 78 03/05/2015 1549   AST 19 03/05/2015 1549   ALT 24 03/05/2015 1549   BILITOT 0.7 03/05/2015 1549      IMPRESSION AND PLAN:  1) Depression: The current medical regimen is effective;  continue present plan and medications. RF'd pristiq  qd, #90, RF x 3.  2) GAD w/panic.  Continue pristiq and prn alprazolam (no rf of this med needed today). Add gabapentin trial:  tid x 3 weeks, then increase to 2 tabs tid. Therapeutic expectations and side effect profile of medication discussed today.  Patient's questions  answered.  An After Visit Summary was printed and given to the patient.  FOLLOW UP: Return in about 6 weeks (around 07/22/2016) for f/u anxiety.  Signed:  Santiago Bumpers, MD           06/10/2016

## 2016-07-23 DIAGNOSIS — N951 Menopausal and female climacteric states: Secondary | ICD-10-CM | POA: Diagnosis not present

## 2016-07-23 DIAGNOSIS — R635 Abnormal weight gain: Secondary | ICD-10-CM | POA: Diagnosis not present

## 2016-07-30 DIAGNOSIS — E782 Mixed hyperlipidemia: Secondary | ICD-10-CM | POA: Diagnosis not present

## 2016-07-30 DIAGNOSIS — E559 Vitamin D deficiency, unspecified: Secondary | ICD-10-CM | POA: Diagnosis not present

## 2016-07-30 DIAGNOSIS — E669 Obesity, unspecified: Secondary | ICD-10-CM | POA: Diagnosis not present

## 2016-07-30 DIAGNOSIS — N951 Menopausal and female climacteric states: Secondary | ICD-10-CM | POA: Diagnosis not present

## 2016-07-31 ENCOUNTER — Telehealth: Payer: Self-pay | Admitting: *Deleted

## 2016-07-31 NOTE — Telephone Encounter (Signed)
Pt called stating that she has not been feeling her self, she has had menopausal symptoms. She stated that she has seen both Heidi Leonard and her GYN and they have advised her that its not menopause. She stated that [facility] She stated that they did some blood work and told her that her testosterone was low (5) and recommended injecting pellets. She wants to know Heidi Leonard's thoughts on this? Is this a good treatment plan? Please advise. Thanks.

## 2016-08-03 NOTE — Telephone Encounter (Signed)
Who is [facility]? I am unable to make any recommendations on this.  I have never heard of low testosterone being a problem for a woman, so I don't know anything about the benefits/or risks of using testosterone pellets in her like she is talking about. --Sorry.

## 2016-08-04 NOTE — Telephone Encounter (Signed)
SW pt and she stated that [facility] is a [employer] that deals with hormone replacement therapy.   She stated that she is surprised that Dr. Milinda CaveMcGowen has never heard of this. She stated that there is plenty of information about it online and if he would "just google it" he would see that a women's testosterone shouldn't be lower that 14 and hers is 5.   She request to fax over lab/report/notes. Fax number given to pt.

## 2016-08-04 NOTE — Telephone Encounter (Addendum)
I reviewed her information she gave me. Once again, I am hesitant to tell her to use testosterone in this case for two reasons: 1) I am not experienced with treating this in women.  I am not sure that her mildly low testosterone level is clinically significant.  2) I would recommend she get her level rechecked b/c testosterone is a hormone that tends to fluctuate throughout the day, and lots of times when a low result is obtained when I check it in men, a repeat is perfectly normal.   I recommend she just discuss this with her provider [facility].  Also, I would make sure the providers there are endocrinologists (the medical specialty who are experts in hormone issues).  If they are not endocrinologists, I recommend she get a second opinion with an endocrinologist.  Let me know if she needs referral.--thx

## 2016-08-05 NOTE — Telephone Encounter (Signed)
Pt advised and voiced understanding. She stated that she's not sure if she wants to go down this road right now but she knows that she wants to find out what is wrong with her. She stated that she will call back if she needs anything else, no referral at this time.

## 2016-08-06 ENCOUNTER — Other Ambulatory Visit: Payer: Self-pay | Admitting: *Deleted

## 2016-08-06 MED ORDER — GABAPENTIN 100 MG PO CAPS: ORAL_CAPSULE | ORAL | 1 refills | Status: DC

## 2016-08-06 NOTE — Telephone Encounter (Signed)
[facility]. Requesting 90 day supply.  RF request for gabapentin LOV: 06/10/16 Next ov: None Last written: 06/10/16 #180 w/ 1RF

## 2016-08-08 ENCOUNTER — Telehealth: Payer: Self-pay | Admitting: *Deleted

## 2016-08-08 NOTE — Telephone Encounter (Signed)
error 

## 2016-08-08 NOTE — Telephone Encounter (Signed)
Vit D is mildly low, I recommend Vit D supplementation OTC.  Total Testo is mildly low, but Free Testo might be just fine.  In addition, her LDL Cholesterol is high, therefore, I recommend not to receive Testo treatment because it would potentially increase her cardiovascular risk.  Instead start on a low cholesterol diet with plenty of vegetables and exercise 5 times a week with interval training and 3 times a week with weight lifting.  Schedule a visit with me if wants to discuss.

## 2016-08-08 NOTE — Telephone Encounter (Signed)
Patient called stating she has been having hot flashes day/not feeling fatigue just not her normal self. She stated that [facility] to [facility], they did blood work and told her that her testosterone was low (5) and recommended injecting pellets. Pt has a copy of labs she would like you to review, and get any recommendations. paper chart and copy of labs on your desk.  Please advise

## 2016-08-11 ENCOUNTER — Encounter: Payer: Self-pay | Admitting: *Deleted

## 2016-08-11 NOTE — Telephone Encounter (Signed)
Sent pt mychart message

## 2016-08-11 NOTE — Telephone Encounter (Signed)
Pt received my chart message today.

## 2017-04-09 ENCOUNTER — Other Ambulatory Visit: Payer: Self-pay | Admitting: Family Medicine

## 2017-05-02 ENCOUNTER — Other Ambulatory Visit: Payer: Self-pay | Admitting: Family Medicine

## 2017-05-07 DIAGNOSIS — H524 Presbyopia: Secondary | ICD-10-CM | POA: Diagnosis not present

## 2017-05-12 ENCOUNTER — Encounter: Payer: Self-pay | Admitting: *Deleted

## 2017-05-13 ENCOUNTER — Ambulatory Visit: Payer: BLUE CROSS/BLUE SHIELD | Admitting: Family Medicine

## 2017-05-13 ENCOUNTER — Encounter: Payer: Self-pay | Admitting: Family Medicine

## 2017-05-13 VITALS — BP 117/76 | HR 87 | Temp 97.9°F | Resp 16 | Ht 67.0 in | Wt 208.0 lb

## 2017-05-13 DIAGNOSIS — R232 Flushing: Secondary | ICD-10-CM

## 2017-05-13 DIAGNOSIS — F909 Attention-deficit hyperactivity disorder, unspecified type: Secondary | ICD-10-CM | POA: Diagnosis not present

## 2017-05-13 DIAGNOSIS — E0590 Thyrotoxicosis, unspecified without thyrotoxic crisis or storm: Secondary | ICD-10-CM | POA: Diagnosis not present

## 2017-05-13 DIAGNOSIS — F411 Generalized anxiety disorder: Secondary | ICD-10-CM | POA: Diagnosis not present

## 2017-05-13 DIAGNOSIS — R400 Somnolence: Secondary | ICD-10-CM

## 2017-05-13 LAB — LUTEINIZING HORMONE: LH: 4.07 m[IU]/mL

## 2017-05-13 LAB — FOLLICLE STIMULATING HORMONE: FSH: 7.3 m[IU]/mL

## 2017-05-13 LAB — T4, FREE: FREE T4: 1.6 ng/dL (ref 0.60–1.60)

## 2017-05-13 LAB — TSH: TSH: 1.01 u[IU]/mL (ref 0.35–4.50)

## 2017-05-13 MED ORDER — METHYLPHENIDATE HCL ER 27 MG PO TB24: ORAL_TABLET | ORAL | 0 refills | Status: DC

## 2017-05-13 NOTE — Progress Notes (Signed)
OFFICE VISIT  05/13/2017   CC:  Chief Complaint  Patient presents with  . Follow-up    Anxiety and Night Sweats   HPI:    Patient is a 52 y.o.  female who presents for f/u hx of MDD and GAD with panic. Pristiq 100 mg qd and xanax 1mg  bid prn--uses xanax VERY RARELY b/c they make her too sleepy. Ongoing lack of adequate efficacy on pristiq, even since increase from 50 mg to 100 mg qd. Chronic worry, some intermittent "down" mood.  Energy is poor.  Irritable and poor concentration.  Easily distractible, forgetful. More problems multitasking.  Trouble getting started with complex tasks--procrastinates.   Some feeling "being driven by a motor" but also some lack of motivation when she does stop to rest. No panic attacks.   Has had lifelong excessive daytime sleepiness.  NO cataplexy.  No snoring or apneic events in sleep.  Having night sweats, increasing frequency lately--most nights.  No distinct hot flashes during the day. Feels heat intolerance most of her day for the last several months at least---she used to have lifelong cold intolerance.   Menstrual cycles: last year they started getting irregular for 1/2 year or so, now back to regularly occurring. No intermenstrual bleeding.  ROS: no diarrhea, no palpitations or heart racing, no tremors.    Past Medical History:  Diagnosis Date  . GAD (generalized anxiety disorder)    with panic attacks  . History of abnormal Pap smear    Repeats are always normal per pt (has GYN MD)  . History of depression    Lexapro helped for a while, Pristique started 04/2011  . Intercostal muscle tear 11/2013   R side  . Migraines   . Subclinical hyperthyroidism 2010   Some low TSHs and normal FT4 and T3  . Vitamin D deficiency 11/2011    Past Surgical History:  Procedure Laterality Date  . Unremarkable.      Outpatient Medications Prior to Visit  Medication Sig Dispense Refill  . ALPRAZolam (XANAX) 1 MG tablet Take 1 tablet (1 mg total) by  mouth 2 (two) times daily as needed for anxiety. 60 tablet 3  . desvenlafaxine (PRISTIQ) 100 MG 24 hr tablet Take 1 tablet (100 mg total) by mouth daily. 90 tablet 3  . valACYclovir (VALTREX) 1000 MG tablet 2 tabs po q12h x 2 doses at onset of outbreak of cold sores 20 tablet 0  . zolmitriptan (ZOMIG) 5 MG tablet TAKE 1 TABLET BY MOUTH AS NEEDED FOR MIGRAINE. 8 tablet 2  . gabapentin (NEURONTIN) 100 MG capsule 2 tabs po tid (Patient not taking: Reported on 05/13/2017) 540 capsule 1   No facility-administered medications prior to visit.     No Known Allergies  ROS As per HPI  PE: Blood pressure 117/76, pulse 87, temperature 97.9 F (36.6 C), temperature source Oral, resp. rate 16, height 5\' 7"  (1.702 m), weight 208 lb (94.3 kg), last menstrual period 05/11/2017, SpO2 98 %. Body mass index is 32.58 kg/m.  Wt Readings from Last 2 Encounters:  05/13/17 208 lb (94.3 kg)  06/10/16 204 lb (92.5 kg)    Gen: alert, oriented x 4, affect pleasant.  Lucid thinking and conversation noted. HEENT: PERRLA, EOMI.   Neck: no LAD, mass, or thyromegaly. CV: RRR, no m/r/g LUNGS: CTA bilat, nonlabored. NEURO: no tremor or tics noted on observation.  Coordination intact. CN 2-12 grossly intact bilaterally, strength 5/5 in all extremeties.  No ataxia.   LABS:  Lab Results  Component Value Date   TSH 1.02 03/05/2015   Lab Results  Component Value Date   WBC 5.1 01/25/2013   HGB 13.8 01/25/2013   HCT 40.5 01/25/2013   MCV 86.9 01/25/2013   PLT 244.0 01/25/2013   Lab Results  Component Value Date   CREATININE 0.74 03/05/2015   BUN 12 03/05/2015   NA 136 03/05/2015   K 4.6 03/05/2015   CL 102 03/05/2015   CO2 27 03/05/2015   Lab Results  Component Value Date   ALT 24 03/05/2015   AST 19 03/05/2015   ALKPHOS 78 03/05/2015   BILITOT 0.7 03/05/2015   Lab Results  Component Value Date   CHOL 211 (H) 01/25/2013   Lab Results  Component Value Date   HDL 47.90 01/25/2013   No results  found for: [facility] Lab Results  Component Value Date   TRIG 73.0 01/25/2013   Lab Results  Component Value Date   CHOLHDL 4 01/25/2013    IMPRESSION AND PLAN:  1) GAD, not well controlled on pristiq, although her depression has been better on this med. Keep this med the same. She may have underlying adult ADD, and lots of her anxiety may be a side secondary symptom of this condition.  Given her excessive daytime somnolence--lifelong--it brings to mind the possibility of narcolepsy. Decided to start empiric trial of generic concerta today, 27mg  qd, #30. Therapeutic expectations and side effect profile of medication discussed today.  Patient's questions answered.  2) Hot flashes hs, daytime heat intolerance: check TSH, T4, T3. Also will check FSH/LH.  An After Visit Summary was printed and given to the patient.  FOLLOW UP: Return in about 1 month (around 06/10/2017) for f/u anx/add/sleepiness.  Signed:  Santiago BumpersPhil McGowen, MD           05/13/2017

## 2017-05-14 LAB — T3: T3, Total: 101 ng/dL (ref 76–181)

## 2017-05-20 ENCOUNTER — Other Ambulatory Visit: Payer: Self-pay | Admitting: *Deleted

## 2017-05-20 DIAGNOSIS — B009 Herpesviral infection, unspecified: Secondary | ICD-10-CM

## 2017-05-20 MED ORDER — VALACYCLOVIR HCL 1 G PO TABS: ORAL_TABLET | ORAL | 0 refills | Status: DC

## 2017-05-20 NOTE — Telephone Encounter (Signed)
[facility]  RF request for valacyclovir LOV: 05/13/17 Next ov: 06/12/17 Last written: 01/07/16 #20 w/ 0RF  Please advise. Thanks.

## 2017-06-10 DIAGNOSIS — F909 Attention-deficit hyperactivity disorder, unspecified type: Secondary | ICD-10-CM

## 2017-06-10 HISTORY — DX: Attention-deficit hyperactivity disorder, unspecified type: F90.9

## 2017-06-12 ENCOUNTER — Ambulatory Visit: Payer: BLUE CROSS/BLUE SHIELD | Admitting: Family Medicine

## 2017-06-12 ENCOUNTER — Encounter: Payer: Self-pay | Admitting: Family Medicine

## 2017-06-12 VITALS — BP 122/76 | HR 85 | Resp 16 | Ht 67.0 in | Wt 205.0 lb

## 2017-06-12 DIAGNOSIS — F411 Generalized anxiety disorder: Secondary | ICD-10-CM

## 2017-06-12 DIAGNOSIS — F909 Attention-deficit hyperactivity disorder, unspecified type: Secondary | ICD-10-CM

## 2017-06-12 MED ORDER — METHYLPHENIDATE HCL ER (OSM) 54 MG PO TBCR: 54.0000 mg | EXTENDED_RELEASE_TABLET | ORAL | 0 refills | Status: DC

## 2017-06-12 NOTE — Progress Notes (Signed)
OFFICE VISIT  06/12/2017   CC:  Chief Complaint  Patient presents with  . Follow-up    anxiety    HPI:    Patient is a 52 y.o. Caucasian female who presents for 1 mo f/u anxiety and ADD with excessive daytime fatigue/somnolence. Added gabapentin trial last visit: 100 tid with instructions to titrate up to  tid. Concerta helps with focus/concentration, better with multitasking better, less forgetfulness. Seems to help primarily in first half of day, feels like it wears off after this. Sleepiness is helped up until about 2 pm as well. No side effects from med.  Anxiety is pretty stable--no improvement on gabapentin trial.   Past Medical History:  Diagnosis Date  . GAD (generalized anxiety disorder)    with panic attacks  . History of abnormal Pap smear    Repeats are always normal per pt (has GYN MD)  . History of depression    Lexapro helped for a while, Pristique started 04/2011  . Intercostal muscle tear 11/2013   R side  . Migraines   . Subclinical hyperthyroidism 2010   Some low TSHs and normal FT4 and T3  . Vitamin D deficiency 11/2011    Past Surgical History:  Procedure Laterality Date  . Unremarkable.      Outpatient Medications Prior to Visit  Medication Sig Dispense Refill  . ALPRAZolam (XANAX) 1 MG tablet Take 1 tablet (1 mg total) by mouth 2 (two) times daily as needed for anxiety. 60 tablet 3  . desvenlafaxine (PRISTIQ) 100 MG 24 hr tablet Take 1 tablet (100 mg total) by mouth daily. 90 tablet 3  . valACYclovir (VALTREX) 1000 MG tablet 2 tabs po q12h x 2 doses at onset of outbreak of cold sores 20 tablet 0  . zolmitriptan (ZOMIG) 5 MG tablet TAKE 1 TABLET BY MOUTH AS NEEDED FOR MIGRAINE. 8 tablet 2  . methylphenidate 27 MG PO TB24 1 tab po qAM 30 tablet 0  . gabapentin (NEURONTIN) 100 MG capsule Take 200 mg by mouth 3 (three) times daily.  1   No facility-administered medications prior to visit.     No Known Allergies  ROS As per  HPI  PE: Blood pressure 122/76, pulse 85, resp. rate 16, height  (1.702 m), weight 205 lb (93 kg), SpO2 98 %. Wt Readings from Last 2 Encounters:  06/12/17 205 lb (93 kg)  05/13/17 208 lb (94.3 kg)    Gen: alert, oriented x 4, affect pleasant.  Lucid thinking and conversation noted. HEENT: PERRLA, EOMI.   Neck: no LAD, mass, or thyromegaly. CV: RRR, no m/r/g LUNGS: CTA bilat, nonlabored. NEURO: no tremor or tics noted on observation.  Coordination intact. CN 2-12 grossly intact bilaterally, strength 5/5 in all extremeties.  No ataxia.   LABS:  Lab Results  Component Value Date   WBC 5.1 01/25/2013   HGB 13.8 01/25/2013   HCT 40.5 01/25/2013   MCV 86.9 01/25/2013   PLT 244.0 01/25/2013   Lab Results  Component Value Date   TSH 1.01 05/13/2017     Chemistry      Component Value Date/Time   NA 136 03/05/2015 1549   K 4.6 03/05/2015 1549   CL 102 03/05/2015 1549   CO2 27 03/05/2015 1549   BUN 12 03/05/2015 1549   CREATININE 0.74 03/05/2015 1549      Component Value Date/Time   CALCIUM 9.3 03/05/2015 1549   ALKPHOS 78 03/05/2015 1549   AST 19 03/05/2015 1549   ALT  24 03/05/2015 1549   BILITOT 0.7 03/05/2015 1549       IMPRESSION AND PLAN:  1) Adult ADHD: some encouraging improvements in ADD sx's and anxiety, but only mildly and for about 6 hours. Will increase to concerta  qd--rx given for #30 today.  2) Anxiety: fairly stable.  Neurontin trial as adjunct med for this problem was of no help. Continue pristiq and prn alprazolam.  An After Visit Summary was printed and given to the patient.  FOLLOW UP: Return in about 1 month (around 07/10/2017) for f/u anx/add/med.  Signed:  Santiago Bumpers, MD           06/12/2017

## 2017-06-30 ENCOUNTER — Other Ambulatory Visit: Payer: Self-pay | Admitting: Family Medicine

## 2017-07-01 ENCOUNTER — Ambulatory Visit: Payer: BLUE CROSS/BLUE SHIELD | Admitting: Family Medicine

## 2017-07-01 ENCOUNTER — Encounter: Payer: Self-pay | Admitting: Family Medicine

## 2017-07-01 VITALS — BP 115/75 | HR 92 | Temp 98.2°F | Resp 16 | Ht 67.0 in | Wt 202.1 lb

## 2017-07-01 DIAGNOSIS — F909 Attention-deficit hyperactivity disorder, unspecified type: Secondary | ICD-10-CM

## 2017-07-01 DIAGNOSIS — E669 Obesity, unspecified: Secondary | ICD-10-CM

## 2017-07-01 MED ORDER — METHYLPHENIDATE HCL ER (OSM) 36 MG PO TBCR: EXTENDED_RELEASE_TABLET | ORAL | 0 refills | Status: DC

## 2017-07-01 NOTE — Progress Notes (Signed)
OFFICE VISIT  07/01/2017   CC:  Chief Complaint  Patient presents with  . Follow-up    ADHD   HPI:    Patient is a 52 y.o. Caucasian female who presents for 3 wk f/u adult ADHD. Initial concerta trial 05/2017 led to some slight improvement. Last visit I increased her concerta to 54 mg qd to try to get increased efficacy AND duration of action. Says this does not give her any benefit at all regarding her symptoms.  No adverse side effects. Says the lower dose actually helped some, but this  dose feels like she is taking nothing.  She remained on pristiq and prn xanax (rare) for her anx/dep-->trial of gabapentin as adjunct med for anxiety did not help so we d/c'd this last visit.  Past Medical History:  Diagnosis Date  . GAD (generalized anxiety disorder)    with panic attacks  . History of abnormal Pap smear    Repeats are always normal per pt (has GYN MD)  . History of depression    Lexapro helped for a while, Pristique started 04/2011  . Intercostal muscle tear 11/2013   R side  . Migraines   . Subclinical hyperthyroidism 2010   Some low TSHs and normal FT4 and T3  . Vitamin D deficiency 11/2011    Past Surgical History:  Procedure Laterality Date  . Unremarkable.      Outpatient Medications Prior to Visit  Medication Sig Dispense Refill  . ALPRAZolam (XANAX) 1 MG tablet Take 1 tablet (1 mg total) by mouth 2 (two) times daily as needed for anxiety. 60 tablet 3  . PRISTIQ 100 MG 24 hr tablet TAKE 1 TABLET BY MOUTH EVERY DAY 90 tablet 1  . valACYclovir (VALTREX) 1000 MG tablet 2 tabs po q12h x 2 doses at onset of outbreak of cold sores 20 tablet 0  . zolmitriptan (ZOMIG) 5 MG tablet TAKE 1 TABLET BY MOUTH AS NEEDED FOR MIGRAINE. 8 tablet 2  . methylphenidate 54 MG PO CR tablet Take 1 tablet (54 mg total) by mouth every morning. 30 tablet 0   No facility-administered medications prior to visit.     No Known Allergies  ROS As per HPI  PE: Blood pressure  115/75, pulse 92, temperature 98.2 F (36.8 C), temperature source Oral, resp. rate 16, height  (1.702 m), weight 202 lb 2 oz (91.7 kg), last menstrual period 06/10/2017, SpO2 96 %. Body mass index is 31.66 kg/m.  Wt Readings from Last 2 Encounters:  07/01/17 202 lb 2 oz (91.7 kg)  06/12/17 205 lb (93 kg)    Gen: alert, oriented x 4, affect pleasant.  Lucid thinking and conversation noted. HEENT: PERRLA, EOMI.   Neck: no LAD, mass, or thyromegaly. CV: RRR, no m/r/g LUNGS: CTA bilat, nonlabored. NEURO: no tremor or tics noted on observation.  Coordination intact. CN 2-12 grossly intact bilaterally, strength 5/5 in all extremeties.  No ataxia.   LABS:  Lab Results  Component Value Date   TSH 1.01 05/13/2017     Chemistry      Component Value Date/Time   NA 136 03/05/2015 1549   K 4.6 03/05/2015 1549   CL 102 03/05/2015 1549   CO2 27 03/05/2015 1549   BUN 12 03/05/2015 1549   CREATININE 0.74 03/05/2015 1549      Component Value Date/Time   CALCIUM 9.3 03/05/2015 1549   ALKPHOS 78 03/05/2015 1549   AST 19 03/05/2015 1549   ALT 24 03/05/2015 1549  BILITOT 0.7 03/05/2015 1549       IMPRESSION AND PLAN:  Adult ADHD, leading to worsening of her GAD. Discussed options today: change stimulants vs increase concerta to  qd dose vs non-stimulant ADHD med trial vs referral to [facility].  Pt wants to stick with the approach of increasing concerta to max dose at this time.  Rx written today for methylphenidate ER , 2 tabs qAM, #60. Therapeutic expectations and side effect profile of medication discussed today.  Patient's questions answered.  An After Visit Summary was printed and given to the patient.  FOLLOW UP: Return in about 1 month (around 07/29/2017) for f/u adhd/anxiety.  Signed:  Santiago Bumpers, MD           07/01/2017

## 2017-07-02 ENCOUNTER — Encounter: Payer: Self-pay | Admitting: Family Medicine

## 2017-07-09 ENCOUNTER — Ambulatory Visit: Payer: BLUE CROSS/BLUE SHIELD | Admitting: Family Medicine

## 2017-07-30 ENCOUNTER — Encounter: Payer: Self-pay | Admitting: Family Medicine

## 2017-07-30 ENCOUNTER — Ambulatory Visit: Payer: BLUE CROSS/BLUE SHIELD | Admitting: Family Medicine

## 2017-07-30 VITALS — BP 101/71 | HR 94 | Temp 98.5°F | Resp 16 | Ht 67.0 in | Wt 200.1 lb

## 2017-07-30 DIAGNOSIS — F988 Other specified behavioral and emotional disorders with onset usually occurring in childhood and adolescence: Secondary | ICD-10-CM

## 2017-07-30 DIAGNOSIS — F411 Generalized anxiety disorder: Secondary | ICD-10-CM

## 2017-07-30 MED ORDER — AMPHETAMINE-DEXTROAMPHETAMINE 10 MG PO TABS: ORAL_TABLET | ORAL | 0 refills | Status: DC

## 2017-07-30 MED ORDER — AMPHETAMINE-DEXTROAMPHET ER 20 MG PO CP24: 20.0000 mg | ORAL_CAPSULE | ORAL | 0 refills | Status: DC

## 2017-07-30 NOTE — Progress Notes (Signed)
OFFICE VISIT  07/30/2017   CC:  Chief Complaint  Patient presents with  . Follow-up    ADHD and Anxiety     HPI:    Patient is a 52 y.o. Caucasian female who presents for 1 mo f/u GAD, MDD, adult ADD. Last visit I increased her generic concerta to max dose of 72mg  qd, and kept her on her usual dosing of pristiq and alprazolam. Response to concerta is good some days, other days she feels anxious all day.  No preceding anxiety on those days and nothing different situationally that could explain increased anxiety on certain days. No side effects. Has been months since having to take xanax--takes this only prn anxiety attack.  Still struggling with all sx's of poor focus, impaired concentration, feeling worried a lot, not finishing projects, frustrated easily, poor mood on and off but nothing persistent.  +Fidgety, not nearly as organized as she used to be.   Past Medical History:  Diagnosis Date  . Adult ADHD 06/2017  . GAD (generalized anxiety disorder)    with panic attacks  . History of abnormal Pap smear    Repeats are always normal per pt (has GYN MD)  . History of depression    Lexapro helped for a while, Pristique started 04/2011  . Intercostal muscle tear 11/2013   R side  . Migraines   . Subclinical hyperthyroidism 2010   Some low TSHs and normal FT4 and T3  . Vitamin D deficiency 11/2011    Past Surgical History:  Procedure Laterality Date  . Unremarkable.      Outpatient Medications Prior to Visit  Medication Sig Dispense Refill  . ALPRAZolam (XANAX) 1 MG tablet Take 1 tablet (1 mg total) by mouth 2 (two) times daily as needed for anxiety. 60 tablet 3  . PRISTIQ 100 MG 24 hr tablet TAKE 1 TABLET BY MOUTH EVERY DAY 90 tablet 1  . valACYclovir (VALTREX) 1000 MG tablet 2 tabs po q12h x 2 doses at onset of outbreak of cold sores 20 tablet 0  . zolmitriptan (ZOMIG) 5 MG tablet TAKE 1 TABLET BY MOUTH AS NEEDED FOR MIGRAINE. 8 tablet 2  . methylphenidate (CONCERTA)  36 MG PO CR tablet 2 tabs po qAM 60 tablet 0   No facility-administered medications prior to visit.     No Known Allergies  ROS As per HPI  PE: Blood pressure 101/71, pulse 94, temperature 98.5 F (36.9 C), temperature source Oral, resp. rate 16, height 5\' 7"  (1.702 m), weight 200 lb 2 oz (90.8 kg), last menstrual period 07/13/2017, SpO2 99 %. Wt Readings from Last 2 Encounters:  07/30/17 200 lb 2 oz (90.8 kg)  07/01/17 202 lb 2 oz (91.7 kg)    Gen: alert, oriented x 4, affect pleasant.  Lucid thinking and conversation noted. HEENT: PERRLA, EOMI.   Neck: no LAD, mass, or thyromegaly. CV: RRR, no m/r/g LUNGS: CTA bilat, nonlabored. NEURO: no tremor or tics noted on observation.  Coordination intact. CN 2-12 grossly intact bilaterally, strength 5/5 in all extremeties.  No ataxia.   LABS:  none  IMPRESSION AND PLAN:  1) Adult ADD: we continue to consider whether or not we have accurately diagnosed this in her. Will try different stimulant: adderall Xr 20mg  qAM and adderall 10mg  q afternoon prn, #30 of each.  No additional Rx's today.  2) GAD: continue pristiq and prn xanax. Controlled substance contract reviewed with patient today.  Patient signed this and it will be placed in the  chart.    An After Visit Summary was printed and given to the patient.  FOLLOW UP: Return in about 1 month (around 08/27/2017) for f/u anx/add.  Signed:  Santiago Bumpers, MD           07/30/2017

## 2017-08-26 ENCOUNTER — Ambulatory Visit: Payer: BLUE CROSS/BLUE SHIELD | Admitting: Family Medicine

## 2017-08-27 ENCOUNTER — Encounter: Payer: Self-pay | Admitting: Family Medicine

## 2017-08-27 ENCOUNTER — Ambulatory Visit: Payer: BLUE CROSS/BLUE SHIELD | Admitting: Family Medicine

## 2017-08-27 VITALS — BP 104/69 | HR 91 | Temp 98.3°F | Resp 16 | Ht 67.0 in | Wt 197.5 lb

## 2017-08-27 DIAGNOSIS — F909 Attention-deficit hyperactivity disorder, unspecified type: Secondary | ICD-10-CM | POA: Diagnosis not present

## 2017-08-27 DIAGNOSIS — Z79899 Other long term (current) drug therapy: Secondary | ICD-10-CM | POA: Diagnosis not present

## 2017-08-27 DIAGNOSIS — E669 Obesity, unspecified: Secondary | ICD-10-CM

## 2017-08-27 DIAGNOSIS — F411 Generalized anxiety disorder: Secondary | ICD-10-CM | POA: Diagnosis not present

## 2017-08-27 MED ORDER — AMPHETAMINE-DEXTROAMPHET ER 20 MG PO CP24: 20.0000 mg | ORAL_CAPSULE | ORAL | 0 refills | Status: DC

## 2017-08-27 MED ORDER — AMPHETAMINE-DEXTROAMPHETAMINE 15 MG PO TABS: 15.0000 mg | ORAL_TABLET | Freq: Every day | ORAL | 0 refills | Status: DC

## 2017-08-27 NOTE — Progress Notes (Signed)
OFFICE VISIT  08/27/2017   CC:  Chief Complaint  Patient presents with  . Follow-up    Anxiety and ADD    HPI:    Patient is a 52 y.o. Caucasian female who presents for 1 mo f/u adult ADD and GAD. She did not respond to methylphenidate. Last visit we started trial of adderall xr 20mg  qAM and 10mg  q afternoon.  We kept her on pristiq and prn xanax for her GAD  Pt states all is going well with the med at current dosing: much improved focus, concentration, task completion.  Less frustration, better multitasking, less impulsivity and restlessness.  Mood is stable. No side effects from the medication. No adverse side effects. This has also helped decrease the signif increased level of anxiety she had been feeling. Feels like afternoon dose is not as effective.  She takes this between 2-3 pm.    Past Medical History:  Diagnosis Date  . Adult ADHD 06/2017  . GAD (generalized anxiety disorder)    with panic attacks  . History of abnormal Pap smear    Repeats are always normal per pt (has GYN MD)  . History of depression    Lexapro helped for a while, Pristique started 04/2011  . Intercostal muscle tear 11/2013   R side  . Migraines   . Subclinical hyperthyroidism 2010   Some low TSHs and normal FT4 and T3  . Vitamin D deficiency 11/2011    Past Surgical History:  Procedure Laterality Date  . Unremarkable.      Outpatient Medications Prior to Visit  Medication Sig Dispense Refill  . ALPRAZolam (XANAX) 1 MG tablet Take 1 tablet (1 mg total) by mouth 2 (two) times daily as needed for anxiety. 60 tablet 3  . PRISTIQ 100 MG 24 hr tablet TAKE 1 TABLET BY MOUTH EVERY DAY 90 tablet 1  . valACYclovir (VALTREX) 1000 MG tablet 2 tabs po q12h x 2 doses at onset of outbreak of cold sores 20 tablet 0  . zolmitriptan (ZOMIG) 5 MG tablet TAKE 1 TABLET BY MOUTH AS NEEDED FOR MIGRAINE. 8 tablet 2  . amphetamine-dextroamphetamine (ADDERALL XR) 20 MG 24 hr capsule Take 1 capsule (20 mg total)  by mouth every morning. 30 capsule 0  . amphetamine-dextroamphetamine (ADDERALL) 10 MG tablet 1 tab po q Afternoon as needed 30 tablet 0   No facility-administered medications prior to visit.     No Known Allergies  ROS As per HPI  PE: Blood pressure 104/69, pulse 91, temperature 98.3 F (36.8 C), temperature source Oral, resp. rate 16, height 5\' 7"  (1.702 m), weight 197 lb 8 oz (89.6 kg), last menstrual period 08/10/2017, SpO2 99 %. Body mass index is 30.93 kg/m.  Wt Readings from Last 2 Encounters:  08/27/17 197 lb 8 oz (89.6 kg)  07/30/17 200 lb 2 oz (90.8 kg)    Gen: alert, oriented x 4, affect pleasant.  Lucid thinking and conversation noted. HEENT: PERRLA, EOMI.   Neck: no LAD, mass, or thyromegaly. CV: RRR, no m/r/g LUNGS: CTA bilat, nonlabored. NEURO: no tremor or tics noted on observation.  Coordination intact. CN 2-12 grossly intact bilaterally, strength 5/5 in all extremeties.  No ataxia.   LABS:    Chemistry      Component Value Date/Time   NA 136 03/05/2015 1549   K 4.6 03/05/2015 1549   CL 102 03/05/2015 1549   CO2 27 03/05/2015 1549   BUN 12 03/05/2015 1549   CREATININE 0.74 03/05/2015 1549  Component Value Date/Time   CALCIUM 9.3 03/05/2015 1549   ALKPHOS 78 03/05/2015 1549   AST 19 03/05/2015 1549   ALT 24 03/05/2015 1549   BILITOT 0.7 03/05/2015 1549       IMPRESSION AND PLAN:  1) Adult ADD: signif positive response to adderall, with exception of her afternoon IR/short acting dose. Continue adderall XR 20mg , 1 qAM, #30--eRx'd 30 d supply today. Increase afternoon adderall IR to 15 mg q 2 pm, #30, d supply today. CSC UTD. UDS obtained today: should have only adderall present ("I can't remember the last dose of xanax I took").  2) GAD: her anxiety is improved since getting on adderall as well. Continue pristiq and prn xanax.  An After Visit Summary was printed and given to the patient.  FOLLOW UP: Return in about 1 month (around  09/24/2017) for f/u adult add.  Signed:  Santiago BumpersPhil Annis Lagoy, MD           08/27/2017

## 2017-08-31 LAB — PAIN MGMT, PROFILE 8 W/CONF, U
6 Acetylmorphine: NEGATIVE ng/mL (ref <10)
Alcohol Metabolites: NEGATIVE ng/mL (ref <500)
Amphetamine: 1615 ng/mL — ABNORMAL HIGH (ref <250)
Amphetamines: POSITIVE ng/mL — AB (ref <500)
Benzodiazepines: NEGATIVE ng/mL (ref <100)
Buprenorphine, Urine: NEGATIVE ng/mL (ref <5)
COCAINE METABOLITE: NEGATIVE ng/mL (ref <150)
CREATININE: 150.1 mg/dL
MARIJUANA METABOLITE: NEGATIVE ng/mL (ref <20)
MDMA: NEGATIVE ng/mL (ref <500)
METHAMPHETAMINE: NEGATIVE ng/mL (ref <250)
OXYCODONE: NEGATIVE ng/mL (ref <100)
Opiates: NEGATIVE ng/mL (ref <100)
Oxidant: NEGATIVE ug/mL (ref <200)
PH: 6.83 (ref 4.5–9.0)

## 2017-09-03 ENCOUNTER — Other Ambulatory Visit: Payer: Self-pay | Admitting: Family Medicine

## 2017-09-17 ENCOUNTER — Ambulatory Visit: Payer: BLUE CROSS/BLUE SHIELD | Admitting: Family Medicine

## 2017-09-17 ENCOUNTER — Encounter: Payer: Self-pay | Admitting: Family Medicine

## 2017-09-17 VITALS — BP 99/67 | HR 87 | Temp 98.0°F | Resp 16 | Ht 67.0 in | Wt 197.4 lb

## 2017-09-17 DIAGNOSIS — F988 Other specified behavioral and emotional disorders with onset usually occurring in childhood and adolescence: Secondary | ICD-10-CM | POA: Diagnosis not present

## 2017-09-17 DIAGNOSIS — F411 Generalized anxiety disorder: Secondary | ICD-10-CM | POA: Diagnosis not present

## 2017-09-17 MED ORDER — AMPHETAMINE-DEXTROAMPHETAMINE 15 MG PO TABS: 15.0000 mg | ORAL_TABLET | Freq: Every day | ORAL | 0 refills | Status: DC

## 2017-09-17 MED ORDER — AMPHETAMINE-DEXTROAMPHET ER 20 MG PO CP24: 20.0000 mg | ORAL_CAPSULE | ORAL | 0 refills | Status: DC

## 2017-09-17 NOTE — Progress Notes (Signed)
OFFICE VISIT  09/17/2017   CC:  Chief Complaint  Patient presents with  . Follow-up    Adult ADD    HPI:    Patient is a 52 y.o. Caucasian female who presents for 3 wks f/u adult ADD.  Last visit she was improved/resonding to adderall.  I kept her on adderall xR 20mg  qd and increased her afternoon IR/short acting adderall to 15mg  q afternoon. I kept her on her pristiq and alprazolam at that time.  UDS with appropriate results last visit.  Says she is about the same.  Finds that delaying her afternoon adderall dose until 3pm helps her extend the benefit of her med more to her liking for her work, [employer]. Finds that since her ADD sx's are better, her level of anxiety and irritability and frustration are significantly improved. NO adverse side effects at all.    Past Medical History:  Diagnosis Date  . Adult ADHD 06/2017  . GAD (generalized anxiety disorder)    with panic attacks  . History of abnormal Pap smear    Repeats are always normal per pt (has GYN MD)  . History of depression    Lexapro helped for a while, Pristique started 04/2011  . Intercostal muscle tear 11/2013   R side  . Migraines   . Subclinical hyperthyroidism 2010   Some low TSHs and normal FT4 and T3  . Vitamin D deficiency 11/2011    Past Surgical History:  Procedure Laterality Date  . Unremarkable.      Outpatient Medications Prior to Visit  Medication Sig Dispense Refill  . ALPRAZolam (XANAX) 1 MG tablet Take 1 tablet (1 mg total) by mouth 2 (two) times daily as needed for anxiety. 60 tablet 3  . PRISTIQ 100 MG 24 hr tablet TAKE 1 TABLET BY MOUTH EVERY DAY 90 tablet 1  . valACYclovir (VALTREX) 1000 MG tablet 2 tabs po q12h x 2 doses at onset of outbreak of cold sores 20 tablet 0  . zolmitriptan (ZOMIG) 5 MG tablet TAKE 1 TABLET BY MOUTH AS NEEDED FOR MIGRAINE. 8 tablet 2  . amphetamine-dextroamphetamine (ADDERALL XR) 20 MG 24 hr capsule Take 1 capsule (20 mg total) by mouth every morning. 30 capsule  0  . amphetamine-dextroamphetamine (ADDERALL) 15 MG tablet Take 1 tablet by mouth daily. 1 tab po q 2 pm daily 30 tablet 0   No facility-administered medications prior to visit.     No Known Allergies  ROS As per HPI  PE: Blood pressure 99/67, pulse 87, temperature 98 F (36.7 C), temperature source Oral, resp. rate 16, height 5\' 7"  (1.702 m), weight 197 lb 6 oz (89.5 kg), last menstrual period 09/08/2017, SpO2 96 %. Body mass index is 30.91 kg/m. Wt Readings from Last 2 Encounters:  09/17/17 197 lb 6 oz (89.5 kg)  08/27/17 197 lb 8 oz (89.6 kg)    Gen: alert, oriented x 4, affect pleasant.  Lucid thinking and conversation noted. HEENT: PERRLA, EOMI.   Neck: no LAD, mass, or thyromegaly. CV: RRR, no m/r/g LUNGS: CTA bilat, nonlabored. NEURO: no tremor or tics noted on observation.  Coordination intact. CN 2-12 grossly intact bilaterally, strength 5/5 in all extremeties.  No ataxia.   LABS:    Chemistry      Component Value Date/Time   NA 136 03/05/2015 1549   K 4.6 03/05/2015 1549   CL 102 03/05/2015 1549   CO2 27 03/05/2015 1549   BUN 12 03/05/2015 1549   CREATININE 0.74 03/05/2015  1549      Component Value Date/Time   CALCIUM 9.3 03/05/2015 1549   ALKPHOS 78 03/05/2015 1549   AST 19 03/05/2015 1549   ALT 24 03/05/2015 1549   BILITOT 0.7 03/05/2015 1549       IMPRESSION AND PLAN:  1) Adult ADD: not ideally improved but satisfactory response to current adderall xr and adderall IR/short acting dosing. I printed rx's for adderall XR 20mg  qd, #30 and adderall IR/short acting 15mg , 1 q afternoon, #30 today for this month, Sept 2019, and Oct 2019.  Appropriate fill on/after date was noted on each rx.  2) GAD/depression: this has improved as well. No change in meds today. She hardly uses xanax at all (last dose > 60mo ago), but we'll keep her on it for prn use b/c it has been helpful when she has needed it. UDS and CSC UTD.  An After Visit Summary was printed and  given to the patient.  FOLLOW UP: Return in about 3 months (around 12/18/2017) for f/u Adult aDD/anx.  Signed:  Santiago Bumpers, MD           09/17/2017

## 2017-09-21 ENCOUNTER — Ambulatory Visit: Payer: BLUE CROSS/BLUE SHIELD | Admitting: Family Medicine

## 2017-12-23 ENCOUNTER — Telehealth: Payer: Self-pay | Admitting: Family Medicine

## 2017-12-23 ENCOUNTER — Ambulatory Visit: Payer: BLUE CROSS/BLUE SHIELD | Admitting: Family Medicine

## 2017-12-23 ENCOUNTER — Encounter: Payer: Self-pay | Admitting: Family Medicine

## 2017-12-23 VITALS — BP 107/73 | HR 98 | Resp 16 | Ht 67.0 in | Wt 200.0 lb

## 2017-12-23 DIAGNOSIS — F988 Other specified behavioral and emotional disorders with onset usually occurring in childhood and adolescence: Secondary | ICD-10-CM | POA: Diagnosis not present

## 2017-12-23 MED ORDER — AMPHETAMINE-DEXTROAMPHETAMINE 15 MG PO TABS: 15.0000 mg | ORAL_TABLET | Freq: Two times a day (BID) | ORAL | 0 refills | Status: DC

## 2017-12-23 MED ORDER — AMPHETAMINE-DEXTROAMPHETAMINE 15 MG PO TABS: 15.0000 mg | ORAL_TABLET | Freq: Three times a day (TID) | ORAL | 0 refills | Status: DC

## 2017-12-23 NOTE — Telephone Encounter (Signed)
OK, pls call her pharmacy and cancel the three rx's I did today for adderall #90.

## 2017-12-23 NOTE — Telephone Encounter (Signed)
This message was copied and pasted to another phone note from pts pharmacy. Please see other phone note.

## 2017-12-23 NOTE — Telephone Encounter (Signed)
Sorry, cant do this. I can write the rx for 15mg  and she can take it twice per day.  Does she want me to send in new rx with these instructions?

## 2017-12-23 NOTE — Telephone Encounter (Signed)
Copied from CRM [phone]. Topic: General - Other >> Dec 23, 2017  9:43 AM Leafy Roobinson, Norma J wrote: Reason for CRM: [facility] the insurance will only pay for adderall twice a day not three times a day and there was no option for a PA

## 2017-12-23 NOTE — Telephone Encounter (Signed)
Pt advised and voiced understanding.    She is okay with Rx for 15mg  BID.

## 2017-12-23 NOTE — Telephone Encounter (Deleted)
FYI

## 2017-12-23 NOTE — Progress Notes (Signed)
OFFICE VISIT  12/23/2017   CC:  Chief Complaint  Patient presents with  . ADHD    follow up   HPI:    Patient is a 52 y.o. Caucasian female who presents for 3 mo f/u GAD, hx of depression, and adult ADD. Last visit she was having a satisfactory response to all of her meds: pristiq, adderall, and prn alprazolam.  Interim Hx: [place]! Overall she is doing okay.  She finds that the adderall short acting gives better efficacy and she asks about stopping her XR in the morning and switching over to short acting adderall tid.  Past Medical History:  Diagnosis Date  . Adult ADHD 06/2017  . GAD (generalized anxiety disorder)    with panic attacks  . History of abnormal Pap smear    Repeats are always normal per pt (has GYN MD)  . History of depression    Lexapro helped for a while, Pristique started 04/2011  . Intercostal muscle tear 11/2013   R side  . Migraines   . Subclinical hyperthyroidism 2010   Some low TSHs and normal FT4 and T3  . Vitamin D deficiency 11/2011    Past Surgical History:  Procedure Laterality Date  . Unremarkable.      Outpatient Medications Prior to Visit  Medication Sig Dispense Refill  . PRISTIQ 100 MG 24 hr tablet TAKE 1 TABLET BY MOUTH EVERY DAY 90 tablet 1  . valACYclovir (VALTREX) 1000 MG tablet 2 tabs po q12h x 2 doses at onset of outbreak of cold sores 20 tablet 0  . zolmitriptan (ZOMIG) 5 MG tablet TAKE 1 TABLET BY MOUTH AS NEEDED FOR MIGRAINE. 8 tablet 2  . amphetamine-dextroamphetamine (ADDERALL XR) 20 MG 24 hr capsule Take 1 capsule (20 mg total) by mouth every morning. 30 capsule 0  . amphetamine-dextroamphetamine (ADDERALL) 15 MG tablet Take 1 tablet by mouth daily. 1 tab po q 2 pm daily 30 tablet 0  . ALPRAZolam (XANAX) 1 MG tablet Take 1 tablet (1 mg total) by mouth 2 (two) times daily as needed for anxiety. (Patient not taking: Reported on 12/23/2017) 60 tablet 3   No facility-administered medications  prior to visit.     No Known Allergies  ROS As per HPI  PE: Blood pressure 107/73, pulse 98, resp. rate 16, height 5\' 7"  (1.702 m), weight 200 lb (90.7 kg), SpO2 97 %. Wt Readings from Last 2 Encounters:  12/23/17 200 lb (90.7 kg)  09/17/17 197 lb 6 oz (89.5 kg)    Gen: alert, oriented x 4, affect pleasant.  Lucid thinking and conversation noted. HEENT: PERRLA, EOMI.   Neck: no LAD, mass, or thyromegaly. CV: RRR, no m/r/g LUNGS: CTA bilat, nonlabored. NEURO: no tremor or tics noted on observation.  Coordination intact. CN 2-12 grossly intact bilaterally, strength 5/5 in all extremeties.  No ataxia.   LABS:    Chemistry      Component Value Date/Time   NA 136 03/05/2015 1549   K 4.6 03/05/2015 1549   CL 102 03/05/2015 1549   CO2 27 03/05/2015 1549   BUN 12 03/05/2015 1549   CREATININE 0.74 03/05/2015 1549      Component Value Date/Time   CALCIUM 9.3 03/05/2015 1549   ALKPHOS 78 03/05/2015 1549   AST 19 03/05/2015 1549   ALT 24 03/05/2015 1549   BILITOT 0.7 03/05/2015 1549     Lab Results  Component Value Date   TSH 1.01 05/13/2017  IMPRESSION AND PLAN:  1) Adult ADD: doing well.   Slight changes to stimulant regimen as per HPI above. Change to 15mg  IR/short acting adderall tid, stop adderall xr. I did electronic rx's for adderall 15mg , 1 tab tid, #90 today for this month, Dec 2019, and Jan 2020.  Appropriate fill on/after date was noted on each rx.  2) Hx of MDD/GAD: very stable.  Continue pristiq.  She is not really needing alprazolam at this time but has some in case of feeling signif overwhelmed or panic.  An After Visit Summary was printed and given to the patient.  FOLLOW UP: Return in about 3 months (around 03/25/2018) for f/u adult ADD.  Signed:  Santiago Bumpers, MD           12/23/2017

## 2017-12-23 NOTE — Telephone Encounter (Signed)
Copied from CRM [id number]. Topic: General - Other >> Dec 23, 2017  9:49 AM Tamela OddiHarris, Nikolas Casher J wrote: Reason for CRM: Patient called to ask the doctor about her dosage for the amphetamine-dextroamphetamine (ADDERALL) 15 MG tablet medication.  The prescription is for 3x day, but her insurance only covers 2x day.  Patient would like to know if the doctor increases the mgs, would she be able to cut the pills so she could then take it 3x day.  Please advise and call patient back at [phone]

## 2017-12-23 NOTE — Telephone Encounter (Signed)
Heidi Leonard, Heidi Leonard 7 minutes ago (9:53 AM)      Copied from CRM [id number]. Topic: General - Other >> Dec 23, 2017  9:49 AM Heidi Leonard, Heidi Leonard wrote: Reason for CRM: Patient called to ask the doctor about her dosage for the amphetamine-dextroamphetamine (ADDERALL) 15 MG tablet medication.  The prescription is for 3x day, but her insurance only covers 2x day.  Patient would like to know if the doctor increases the mgs, would she be able to cut the pills so she could then take it 3x day.  Please advise and call patient back at [phone]       Documentation     Huntley DecStewart, Irelynd M [phone]  Heidi Leonard, Heidi Leonard 7 minutes ago (9:52 AM)

## 2017-12-23 NOTE — Telephone Encounter (Signed)
John at [facility] advised to cancel the 3 Rx's sent today for Adderall #90. Rx's cancelled.

## 2017-12-23 NOTE — Telephone Encounter (Signed)
Please advise. Thanks.  

## 2017-12-25 ENCOUNTER — Other Ambulatory Visit: Payer: Self-pay | Admitting: Family Medicine

## 2018-02-06 ENCOUNTER — Encounter: Payer: Self-pay | Admitting: Family Medicine

## 2018-02-08 NOTE — Telephone Encounter (Signed)
Please advise. Thanks.  

## 2018-02-08 NOTE — Telephone Encounter (Signed)
I can do this. Initial visit for this needs to be 30 minutes, though.

## 2018-02-10 DIAGNOSIS — M7918 Myalgia, other site: Secondary | ICD-10-CM

## 2018-02-10 HISTORY — DX: Myalgia, other site: M79.18

## 2018-02-16 ENCOUNTER — Encounter: Payer: Self-pay | Admitting: Family Medicine

## 2018-02-16 ENCOUNTER — Ambulatory Visit: Payer: BLUE CROSS/BLUE SHIELD | Admitting: Family Medicine

## 2018-02-16 VITALS — BP 113/77 | HR 99 | Temp 98.2°F | Resp 16 | Ht 67.0 in | Wt 211.0 lb

## 2018-02-16 DIAGNOSIS — M7918 Myalgia, other site: Secondary | ICD-10-CM

## 2018-02-16 DIAGNOSIS — M791 Myalgia, unspecified site: Secondary | ICD-10-CM

## 2018-02-16 MED ORDER — METHYLPREDNISOLONE ACETATE 40 MG/ML IJ SUSP
40.0000 mg | Freq: Once | INTRAMUSCULAR | Status: AC
  Administered 2018-02-16: 40 mg via INTRAMUSCULAR

## 2018-02-16 NOTE — Progress Notes (Signed)
OFFICE VISIT  03/03/2018   CC:  Chief Complaint  Patient presents with  . Back Pain    discuss trigger point injections    HPI:    Patient is a 53 y.o. Caucasian female who presents for concern about her headaches being tension-related, generated perhaps by myofascial pain in upper back and neck.   Specifically, she has researched trigger points and feels like she has these and is interested in possibly getting one or more injected to see if her HAs improve.  Over the last couple years she has noted multiple focal areas of pain in upper back/tops of shoulders and she gets tension HAs starting in neck/occipital area and spread to temples and forehead.  She uses a "knobber" for massage and this seems to help some but not as effective lately.   Gets deep tissue massages about every 2-3 wks. Zomig also helps the HAs well.    She occ tries NSAID but stretching/massage helps best.  No HA today.  Feels some tightness in upper back and traps region bilat and one on R side of neck soft tissues ("at least 5 on each side of back, if not more, that are always there".  Past Medical History:  Diagnosis Date  . Adult ADHD 06/2017  . GAD (generalized anxiety disorder)    with panic attacks  . History of abnormal Pap smear    Repeats are always normal per pt (has GYN MD)  . History of depression    Lexapro helped for a while, Pristique started 04/2011  . Intercostal muscle tear 11/2013   R side  . Migraines   . Subclinical hyperthyroidism 2010   Some low TSHs and normal FT4 and T3  . Vitamin D deficiency 11/2011    Past Surgical History:  Procedure Laterality Date  . Unremarkable.      Outpatient Medications Prior to Visit  Medication Sig Dispense Refill  . amphetamine-dextroamphetamine (ADDERALL) 15 MG tablet Take 1 tablet by mouth 2 (two) times daily. 60 tablet 0  . desvenlafaxine (PRISTIQ) 100 MG 24 hr tablet TAKE 1 TABLET BY MOUTH EVERY DAY 90 tablet 1  . valACYclovir (VALTREX) 1000  MG tablet 2 tabs po q12h x 2 doses at onset of outbreak of cold sores 20 tablet 0  . zolmitriptan (ZOMIG) 5 MG tablet TAKE 1 TABLET BY MOUTH AS NEEDED FOR MIGRAINE. 8 tablet 2  . ALPRAZolam (XANAX) 1 MG tablet Take 1 tablet (1 mg total) by mouth 2 (two) times daily as needed for anxiety. (Patient not taking: Reported on 12/23/2017) 60 tablet 3   No facility-administered medications prior to visit.     No Known Allergies  ROS As per HPI  PE: Blood pressure 113/77, pulse 99, temperature 98.2 F (36.8 C), temperature source Oral, resp. rate 16, height 5\' 7"  (1.702 m), weight 211 lb (95.7 kg), last menstrual period 02/07/2018, SpO2 97 %. Gen: Alert, well appearing.  Patient is oriented to person, place, time, and situation. AFFECT: pleasant, lucid thought and speech. Back: mild tightness and pressure to palpation in mid to upper back diffusely, with a couple of mildly tender trigger points on each side over suprascapular region.  LABS:    Chemistry      Component Value Date/Time   NA 136 03/05/2015 1549   K 4.6 03/05/2015 1549   CL 102 03/05/2015 1549   CO2 27 03/05/2015 1549   BUN 12 03/05/2015 1549   CREATININE 0.74 03/05/2015 1549      Component  Value Date/Time   CALCIUM 9.3 03/05/2015 1549   ALKPHOS 78 03/05/2015 1549   AST 19 03/05/2015 1549   ALT 24 03/05/2015 1549   BILITOT 0.7 03/05/2015 1549     Lab Results  Component Value Date   TSH 1.01 05/13/2017    IMPRESSION AND PLAN:  Myofascial upper back pain, chronic. Trigger points: injected 2 of these today. A trigger point injection was performed at the site of maximal tenderness using 1/2 ml 1% plain Lidocaine and 1/2 ml of 40mg /ml depomedrol. This was well tolerated, no immediate complications. Injection aftercare discussed.  An After Visit Summary was printed and given to the patient.  FOLLOW UP: Return in about 2 weeks (around 03/02/2018) for f/u myofascial pain/trigger points/HA's.  Signed:  Santiago Bumpers, MD            03/03/2018

## 2018-03-03 ENCOUNTER — Ambulatory Visit: Payer: BLUE CROSS/BLUE SHIELD | Admitting: Family Medicine

## 2018-03-23 ENCOUNTER — Ambulatory Visit: Payer: BLUE CROSS/BLUE SHIELD | Admitting: Family Medicine

## 2018-03-23 ENCOUNTER — Encounter: Payer: Self-pay | Admitting: Family Medicine

## 2018-03-23 VITALS — BP 118/75 | HR 100 | Temp 97.9°F | Resp 16 | Ht 67.0 in | Wt 208.1 lb

## 2018-03-23 DIAGNOSIS — F909 Attention-deficit hyperactivity disorder, unspecified type: Secondary | ICD-10-CM | POA: Diagnosis not present

## 2018-03-23 DIAGNOSIS — M7918 Myalgia, other site: Secondary | ICD-10-CM | POA: Diagnosis not present

## 2018-03-23 MED ORDER — AMPHETAMINE-DEXTROAMPHETAMINE 15 MG PO TABS: 15.0000 mg | ORAL_TABLET | Freq: Two times a day (BID) | ORAL | 0 refills | Status: DC

## 2018-03-23 NOTE — Progress Notes (Signed)
OFFICE VISIT  03/23/2018   CC:  Chief Complaint  Patient presents with  . Follow-up    adult ADD   HPI:    Patient is a 53 y.o. Caucasian female who presents for 3 mo f/u adult ADD. Last visit we stopped her adderall xr dose in the morning and changed her over to 15mg  adderall IR/short acting BID.  Pt states all is going well with the med at current dosing: much improved focus, concentration, task completion.  Less frustration, better multitasking, less impulsivity and restlessness.  Mood is stable. No side effects from the medication.  Trigger point injections a few weeks ago here did not make signifi difference in her myofascial thoracic and cerv pains. She has many points so near together that a couple of injections is difficult to tell the effects clearly.  Past Medical History:  Diagnosis Date  . Adult ADHD 06/2017  . GAD (generalized anxiety disorder)    with panic attacks  . History of abnormal Pap smear    Repeats are always normal per pt (has GYN MD)  . History of depression    Lexapro helped for a while, Pristique started 04/2011  . Intercostal muscle tear 11/2013   R side  . Migraines   . Subclinical hyperthyroidism 2010   Some low TSHs and normal FT4 and T3  . Vitamin D deficiency 11/2011    Past Surgical History:  Procedure Laterality Date  . Unremarkable.      Outpatient Medications Prior to Visit  Medication Sig Dispense Refill  . amphetamine-dextroamphetamine (ADDERALL) 15 MG tablet Take 1 tablet by mouth 2 (two) times daily. 60 tablet 0  . desvenlafaxine (PRISTIQ) 100 MG 24 hr tablet TAKE 1 TABLET BY MOUTH EVERY DAY 90 tablet 1  . valACYclovir (VALTREX) 1000 MG tablet 2 tabs po q12h x 2 doses at onset of outbreak of cold sores 20 tablet 0  . zolmitriptan (ZOMIG) 5 MG tablet TAKE 1 TABLET BY MOUTH AS NEEDED FOR MIGRAINE. 8 tablet 2   No facility-administered medications prior to visit.     No Known Allergies  ROS As per HPI  PE: Blood pressure  118/75, pulse 100, temperature 97.9 F (36.6 C), temperature source Oral, resp. rate 16, height 5\' 7"  (1.702 m), weight 208 lb 2 oz (94.4 kg), last menstrual period 03/05/2018, SpO2 100 %. Wt Readings from Last 2 Encounters:  03/23/18 208 lb 2 oz (94.4 kg)  02/16/18 211 lb (95.7 kg)    Gen: alert, oriented x 4, affect pleasant.  Lucid thinking and conversation noted. HEENT: PERRLA, EOMI.   Neck: no LAD, mass, or thyromegaly. CV: RRR, no m/r/g LUNGS: CTA bilat, nonlabored. NEURO: no tremor or tics noted on observation.  Coordination intact. CN 2-12 grossly intact bilaterally, strength 5/5 in all extremeties.  No ataxia.   LABS:    Chemistry      Component Value Date/Time   NA 136 03/05/2015 1549   K 4.6 03/05/2015 1549   CL 102 03/05/2015 1549   CO2 27 03/05/2015 1549   BUN 12 03/05/2015 1549   CREATININE 0.74 03/05/2015 1549      Component Value Date/Time   CALCIUM 9.3 03/05/2015 1549   ALKPHOS 78 03/05/2015 1549   AST 19 03/05/2015 1549   ALT 24 03/05/2015 1549   BILITOT 0.7 03/05/2015 1549     Lab Results  Component Value Date   TSH 1.01 05/13/2017   Lab Results  Component Value Date   CHOL 211 (H) 01/25/2013  HDL 47.90 01/25/2013   LDLDIRECT 161.3 01/25/2013   TRIG 73.0 01/25/2013   CHOLHDL 4 01/25/2013   Lab Results  Component Value Date   WBC 5.1 01/25/2013   HGB 13.8 01/25/2013   HCT 40.5 01/25/2013   MCV 86.9 01/25/2013   PLT 244.0 01/25/2013    IMPRESSION AND PLAN:  1) Adult ADD: she is getting significant improvement in sx's from treatment. I did electronic rx's for adderall 15mg , 1 tab bid, #60  today for this month, March 2020, and April 2020.  Appropriate fill on/after date was noted on each rx. CSC and UDS UTD.  2) Myofascial upper back/trap pain: trigger points are too numerous to really get much benefit from injections. She will call/return for injection if ONE point seems to persist and be much more severe than the rest of her  areas.  An After Visit Summary was printed and given to the patient.  FOLLOW UP: Return in about 3 months (around 06/21/2018) for f/u adult ADD.  Signed:  Santiago Bumpers, MD           03/23/2018

## 2018-06-11 ENCOUNTER — Encounter: Payer: Self-pay | Admitting: Family Medicine

## 2018-06-11 ENCOUNTER — Ambulatory Visit (INDEPENDENT_AMBULATORY_CARE_PROVIDER_SITE_OTHER): Payer: BLUE CROSS/BLUE SHIELD | Admitting: Family Medicine

## 2018-06-11 DIAGNOSIS — M7918 Myalgia, other site: Secondary | ICD-10-CM | POA: Diagnosis not present

## 2018-06-11 DIAGNOSIS — F988 Other specified behavioral and emotional disorders with onset usually occurring in childhood and adolescence: Secondary | ICD-10-CM | POA: Diagnosis not present

## 2018-06-11 MED ORDER — CYCLOBENZAPRINE HCL 5 MG PO TABS: ORAL_TABLET | ORAL | 3 refills | Status: DC

## 2018-06-11 MED ORDER — AMPHETAMINE-DEXTROAMPHETAMINE 15 MG PO TABS: 15.0000 mg | ORAL_TABLET | Freq: Two times a day (BID) | ORAL | 0 refills | Status: DC

## 2018-06-11 NOTE — Progress Notes (Signed)
Virtual Visit via Video Note  I connected with pt on 06/11/18 at  9:00 AM EDT by a video enabled telemedicine application but this had to be converted to telephone visit due to technical problems--> and verified that I am speaking with the correct person using two identifiers.  Location patient: home Location provider:work or home office Persons participating in the virtual visit: patient, provider  I discussed the limitations of evaluation and management by telemedicine and the availability of in person appointments. The patient expressed understanding and agreed to proceed.  Telemedicine visit is a necessity given the COVID-19 restrictions in place at the current time.  HPI: 53 y/o WF being seen today for 3 mo f/u adult ADD. Last f/u visit she was doing fairly well so I continued her on adderall IR/short acting 15mg  bid.  Interim hx:   Pt states all is going well with the med at current dosing: much improved focus, concentration, task completion.  Less frustration, better multitasking, less impulsivity and restlessness.  Mood is stable. No side effects from the medication. Asks for RF of pristiq today.  Really struggling with her myofascial pain, not able to get her deep tissue massages lately b/c of the covid-19 restrictions.  Has tried biofreeze, otc lidocaine patches. She is miserable, wonders if a muscle relaxer could help any. Areas involved: upper back/traps, back of neck.  Some above fat pads around knees and elbows. No joint pains, joint swelling, or rash.   Mild fatigue, chronic.    ROS: See pertinent positives and negatives per HPI.  Past Medical History:  Diagnosis Date  . Adult ADHD 06/2017  . GAD (generalized anxiety disorder)    with panic attacks  . History of abnormal Pap smear    Repeats are always normal per pt (has GYN MD)  . History of depression    Lexapro helped for a while, Pristique started 04/2011  . Intercostal muscle tear 11/2013   R side  .  Migraines   . Myofascial pain 2020   upper back/neck  . Subclinical hyperthyroidism 2010   Some low TSHs and normal FT4 and T3  . Vitamin D deficiency 11/2011    Past Surgical History:  Procedure Laterality Date  . Unremarkable.      Family History  Problem Relation Age of Onset  . Cancer Maternal Aunt   . Healthy Mother   . Other Father        Unknown Health status.  . Hypertension Sister     Current Outpatient Medications:  .  amphetamine-dextroamphetamine (ADDERALL) 15 MG tablet, Take 1 tablet by mouth 2 (two) times daily., Disp: 60 tablet, Rfl: 0 .  desvenlafaxine (PRISTIQ) 100 MG 24 hr tablet, TAKE 1 TABLET BY MOUTH EVERY DAY, Disp: 90 tablet, Rfl: 1 .  Multiple Vitamins-Calcium (ONE-A-DAY WOMENS PO), Take by mouth. Take 1 daily., Disp: , Rfl:  .  valACYclovir (VALTREX) 1000 MG tablet, 2 tabs po q12h x 2 doses at onset of outbreak of cold sores, Disp: 20 tablet, Rfl: 0 .  zolmitriptan (ZOMIG) 5 MG tablet, TAKE 1 TABLET BY MOUTH AS NEEDED FOR MIGRAINE., Disp: 8 tablet, Rfl: 2  EXAM:  VITALS per patient if applicable: There were no vitals taken for this visit.   GENERAL: alert, oriented, appears well and in no acute distress  HEENT: atraumatic, conjunttiva clear, no obvious abnormalities on inspection of external nose and ears  NECK: normal movements of the head and neck  LUNGS: on inspection no signs of respiratory distress,  breathing rate appears normal, no obvious gross SOB, gasping or wheezing  CV: no obvious cyanosis  MS: moves all visible extremities without noticeable abnormality  PSYCH/NEURO: pleasant and cooperative, no obvious depression or anxiety, speech and thought processing grossly intact  LABS: none today    Chemistry      Component Value Date/Time   NA 136 03/05/2015 1549   K 4.6 03/05/2015 1549   CL 102 03/05/2015 1549   CO2 27 03/05/2015 1549   BUN 12 03/05/2015 1549   CREATININE 0.74 03/05/2015 1549      Component Value Date/Time    CALCIUM 9.3 03/05/2015 1549   ALKPHOS 78 03/05/2015 1549   AST 19 03/05/2015 1549   ALT 24 03/05/2015 1549   BILITOT 0.7 03/05/2015 1549     Lab Results  Component Value Date   TSH 1.01 05/13/2017    ASSESSMENT AND PLAN:  Discussed the following assessment and plan:  1) Adult ADD:  The current medical regimen is effective;  continue present plan and medications. I did electronic rx's for adderall 15mg , 1 bid, #60 today for this month, June 2020, and July 2020.  Appropriate fill on/after date was noted on each rx.  2) Myofascial pain, suspect fibromyalgia: she has tried all otc topical treatments except capsaicin. Try capsaicin. Trial of flexeril 5-10mg  tid prn started today. Gabapentin trial for myalgias if flexeril no help.  Could also consider lyrica if gabapentin fails.  No labs needed at this time.   Spent 15 min with pt today, with >50% of this time spent in counseling and care coordination regarding the above problems.  I discussed the assessment and treatment plan with the patient. The patient was provided an opportunity to ask questions and all were answered. The patient agreed with the plan and demonstrated an understanding of the instructions.   The patient was advised to call back or seek an in-person evaluation if the symptoms worsen or if the condition fails to improve as anticipated.  F/u: 3-4 mo RCI  Signed:  Santiago BumpersPhil , MD           06/11/2018

## 2018-06-11 NOTE — Addendum Note (Signed)
Addended by: Jeoffrey Massed on: 06/11/2018 09:27 AM   Modules accepted: Orders

## 2018-06-14 ENCOUNTER — Telehealth: Payer: Self-pay

## 2018-06-14 ENCOUNTER — Ambulatory Visit: Payer: BLUE CROSS/BLUE SHIELD | Admitting: Family Medicine

## 2018-06-14 MED ORDER — TRAMADOL HCL 50 MG PO TABS: ORAL_TABLET | ORAL | 0 refills | Status: DC

## 2018-06-14 NOTE — Telephone Encounter (Signed)
Pt was notified.  

## 2018-06-14 NOTE — Telephone Encounter (Signed)
SW pt this morning, pt states she has been taking TID since Friday. Advised would inform PCP regarding this and give call back once decision is made.  Please advise, thanks.    Copied from CRM [id number]. Topic: General - Other >> Jun 14, 2018  8:25 AM Jaquita Rector A wrote: Reason for CRM: Patient called to say that the Rx called in on Friday 06/11/2018 for cyclobenzaprine (FLEXERIL) 5 MG tablet is not working. She is asking if there is a pain medication that can be called in to the pharmacy. Also request a call back at Ph# [phone]

## 2018-06-14 NOTE — Telephone Encounter (Signed)
Tramadol eRx'd 

## 2018-06-17 ENCOUNTER — Encounter: Payer: Self-pay | Admitting: Family Medicine

## 2018-06-17 ENCOUNTER — Telehealth: Payer: Self-pay

## 2018-06-17 NOTE — Telephone Encounter (Signed)
Letter printed & signed.

## 2018-06-17 NOTE — Progress Notes (Signed)
Letter written only--no encounter.

## 2018-06-17 NOTE — Telephone Encounter (Signed)
Pt was notified regarding letter. Advised letter was accessible via MyChart and faxed to her.

## 2018-06-17 NOTE — Telephone Encounter (Signed)
Last OV was 06/11/18 for ADHD and myofascial pain.  Please advise, thanks.  Copied from CRM [id number]. Topic: General - Inquiry >> Jun 17, 2018 10:25 AM Heidi Leonard, NT wrote: Reason for CRM: Patient is calling in stating she would like Dr.McGowen to write a letter to allow her to go back to massage therapy. States an order will be placed today by [facility] executive order 138, where a letter is required so patient can get medical massage therapy. Call back is [phone].

## 2018-06-24 ENCOUNTER — Other Ambulatory Visit: Payer: Self-pay | Admitting: Family Medicine

## 2018-07-07 ENCOUNTER — Telehealth: Payer: Self-pay | Admitting: Family Medicine

## 2018-07-07 NOTE — Telephone Encounter (Signed)
Patient call into office. Requesting an in office appt with Dr. Milinda Cave. Refused virtual appt - states that she doesn't think a virtual appt will help with her condition.   Back, neck, shoulder pain.She wants to go over trigger points  Please advise.

## 2018-07-07 NOTE — Telephone Encounter (Signed)
LMTCB regarding appt scheduling. Last OV from 06/11/18, advised to f/u 3-4 months for RCI.

## 2018-07-07 NOTE — Telephone Encounter (Signed)
SW pt and she has been doing Nurse, mental health on myofascial pain. She has seen massage therapist 3 times. Last OV on 06/11/18 started trial of Flexeril and taking Gabapentin but still having pain.  Requesting in office visit to discuss pain and cause of it. Offered virtual visit and declined. Please advise, thanks.

## 2018-07-07 NOTE — Telephone Encounter (Signed)
Patient returned your call. Please call her at work instead of trying to reach on cell. Work is [phone]  Thank you

## 2018-07-07 NOTE — Telephone Encounter (Signed)
Pt has been scheduled for next week for in office visit.

## 2018-07-07 NOTE — Telephone Encounter (Signed)
In office visit is fine 

## 2018-07-12 DIAGNOSIS — M5030 Other cervical disc degeneration, unspecified cervical region: Secondary | ICD-10-CM

## 2018-07-12 HISTORY — DX: Other cervical disc degeneration, unspecified cervical region: M50.30

## 2018-07-16 ENCOUNTER — Encounter: Payer: Self-pay | Admitting: Family Medicine

## 2018-07-16 ENCOUNTER — Other Ambulatory Visit: Payer: Self-pay

## 2018-07-16 ENCOUNTER — Ambulatory Visit (INDEPENDENT_AMBULATORY_CARE_PROVIDER_SITE_OTHER): Payer: BLUE CROSS/BLUE SHIELD | Admitting: Family Medicine

## 2018-07-16 VITALS — BP 122/72 | HR 97 | Temp 97.2°F | Resp 16 | Ht 67.0 in | Wt 213.4 lb

## 2018-07-16 DIAGNOSIS — M797 Fibromyalgia: Secondary | ICD-10-CM

## 2018-07-16 DIAGNOSIS — M7918 Myalgia, other site: Secondary | ICD-10-CM

## 2018-07-16 DIAGNOSIS — M542 Cervicalgia: Secondary | ICD-10-CM | POA: Diagnosis not present

## 2018-07-16 MED ORDER — ZOLMITRIPTAN 5 MG PO TABS: ORAL_TABLET | ORAL | 2 refills | Status: DC

## 2018-07-16 MED ORDER — TRAMADOL HCL 50 MG PO TABS: ORAL_TABLET | ORAL | 0 refills | Status: DC

## 2018-07-16 MED ORDER — PREGABALIN 50 MG PO CAPS: ORAL_CAPSULE | ORAL | 0 refills | Status: DC

## 2018-07-16 NOTE — Progress Notes (Signed)
OFFICE VISIT  07/16/2018   CC:  Chief Complaint  Patient presents with  . discuss myofascial pain   HPI:    Patient is a 53 y.o. Caucasian female who presents for ongoing myofascial pain. Ongoing problems (x years, but worsening last 6 mo and with more frequent "flares") with diffuse soft tissue body pain from neck to shoulders and deltoids into medial aspects of elbows, bilat greater troch regions, some lateral thigh, and even some focal areas behind both ears.   Has frequent occipital pain that sometimes turns into a migrain, for which zomig consistently helps. Chronic fatigue and "brain fog"--forgetfulness, poor organization, has to repeat tasks, gets flustered easily. Feels hot in normal temps constantly, also with periods of typical menopausal hot flashes. No rashes.  No muscular weakness.  No joint pain or swelling or redness.  No edema.  No vision c/o. Mood is down on and off but nothing significant or prolonged.  Anxiety is chronic and low/mod level but no panic. She still is able to function but quality of life suffers.  She has tried gabapentin, amitriptyline, cyclobenzaprine, and cymbalta--none of which helped.  Recent tramadol trial has been mildly helpful taking it 1-2 tab bid. Deep tissue massage does help significantly for about a week at the most.  She has never tried PT or chiropracter/myofascial treatments.  I have done a few trigger point injections w/out any significant effect.  No fevers, no rashes.  No eye redness.  No CP, SOB, palpitations, or abd pains.  Past Medical History:  Diagnosis Date  . Adult ADHD 06/2017  . GAD (generalized anxiety disorder)    with panic attacks  . History of abnormal Pap smear    Repeats are always normal per pt (has GYN MD)  . History of depression    Lexapro helped for a while, Pristique started 04/2011  . Intercostal muscle tear 11/2013   R side  . Migraines   . Myofascial pain 2020   upper back/neck  . Subclinical  hyperthyroidism 2010   Some low TSHs and normal FT4 and T3  . Vitamin D deficiency 11/2011    Past Surgical History:  Procedure Laterality Date  . Unremarkable.      Outpatient Medications Prior to Visit  Medication Sig Dispense Refill  . amphetamine-dextroamphetamine (ADDERALL) 15 MG tablet Take 1 tablet by mouth 2 (two) times daily. 60 tablet 0  . Multiple Vitamins-Calcium (ONE-A-DAY WOMENS PO) Take by mouth. Take 1 daily.    Marland Kitchen zolmitriptan (ZOMIG) 5 MG tablet TAKE 1 TABLET BY MOUTH AS NEEDED FOR MIGRAINE. 8 tablet 2  . traMADol (ULTRAM) 50 MG tablet 1-2 tabs po bid prn pain 30 tablet 0  . cyclobenzaprine (FLEXERIL) 5 MG tablet 1-2 tabs po tid prn (Patient not taking: Reported on 07/16/2018) 30 tablet 3  . desvenlafaxine (PRISTIQ) 100 MG 24 hr tablet TAKE 1 TABLET BY MOUTH EVERY DAY (Patient not taking: Reported on 07/16/2018) 90 tablet 0  . valACYclovir (VALTREX) 1000 MG tablet 2 tabs po q12h x 2 doses at onset of outbreak of cold sores (Patient not taking: Reported on 07/16/2018) 20 tablet 0   No facility-administered medications prior to visit.     No Known Allergies  ROS As per HPI  PE: Blood pressure 122/72, pulse 97, temperature (!) 97.2 F (36.2 C), temperature source Temporal, resp. rate 16, height _0  (1.702 m), weight 213 lb 6.4 oz (96.8 kg), SpO2 98 %. Exam chaperoned by Deveron Furlong, CMA. Gen: Alert, well appearing.  Patient is oriented to person, place, time, and situation. AFFECT: pleasant, lucid thought and speech. LOV:FIEP: no injection, icteris, swelling, or exudate.  EOMI, PERRLA. Mouth: lips without lesion/swelling.  Oral mucosa pink and moist. Oropharynx without erythema, exudate, or swelling.  Neck - No masses or thyromegaly or limitation in range of motion Posterior and lateral neck soft tissues diffusely TTP, with a few focal points of tenderness along upper back, mid back, and shoulders symmetrically.  TTP bilat greater trochs and lateral thighs.  BIlat  medial elbows TTP.  Wrists and fingers w/out tenderness.  Knees, calfs, and ankles/feet all w/out pain.  No joint pain or swelling or erythema.  ROM of all joints intact.   LABS:    Chemistry      Component Value Date/Time   NA 136 03/05/2015 1549   K 4.6 03/05/2015 1549   CL 102 03/05/2015 1549   CO2 27 03/05/2015 1549   BUN 12 03/05/2015 1549   CREATININE 0.74 03/05/2015 1549      Component Value Date/Time   CALCIUM 9.3 03/05/2015 1549   ALKPHOS 78 03/05/2015 1549   AST 19 03/05/2015 1549   ALT 24 03/05/2015 1549   BILITOT 0.7 03/05/2015 1549     Lab Results  Component Value Date   TSH 1.01 05/13/2017    IMPRESSION AND PLAN:  1) Fibromyalgia syndrome suspected. Check cpk, tsh, cbc, cmet, esr. Start trial of lyrica since she has failed trials of other agents known to help fibromyalgia. Start 2m bid x 15d, then increase to 100 mg bid. Neck pain is not likely DDD/DJD but could be contributing some.  Will check c spine plain films. She'll continue tramadol 556m1-2 bid prn, #120 rx'd today, no RF. D/C cyclobenzaprine b/c this no help. Continue deep tissue massages.  An After Visit Summary was printed and given to the patient.  FOLLOW UP: Return in about 3 weeks (around 08/06/2018) for f/u fibromyalgia.  Signed:  PhCrissie SicklesMD           07/16/2018

## 2018-07-17 LAB — COMPREHENSIVE METABOLIC PANEL
AG Ratio: 1.6 (calc) (ref 1.0–2.5)
ALT: 32 U/L — ABNORMAL HIGH (ref 6–29)
AST: 24 U/L (ref 10–35)
Albumin: 4.3 g/dL (ref 3.6–5.1)
Alkaline phosphatase (APISO): 90 U/L (ref 37–153)
BUN: 14 mg/dL (ref 7–25)
CO2: 25 mmol/L (ref 20–32)
Calcium: 9.9 mg/dL (ref 8.6–10.4)
Chloride: 104 mmol/L (ref 98–110)
Creat: 0.72 mg/dL (ref 0.50–1.05)
Globulin: 2.7 g/dL (calc) (ref 1.9–3.7)
Glucose, Bld: 91 mg/dL (ref 65–99)
Potassium: 4.4 mmol/L (ref 3.5–5.3)
Sodium: 138 mmol/L (ref 135–146)
Total Bilirubin: 0.5 mg/dL (ref 0.2–1.2)
Total Protein: 7 g/dL (ref 6.1–8.1)

## 2018-07-17 LAB — CBC WITH DIFFERENTIAL/PLATELET
Absolute Monocytes: 706 cells/uL (ref 200–950)
Basophils Absolute: 59 cells/uL (ref 0–200)
Basophils Relative: 0.7 %
Eosinophils Absolute: 118 cells/uL (ref 15–500)
Eosinophils Relative: 1.4 %
HCT: 42 % (ref 35.0–45.0)
Hemoglobin: 14.3 g/dL (ref 11.7–15.5)
Lymphs Abs: 2428 cells/uL (ref 850–3900)
MCH: 29.2 pg (ref 27.0–33.0)
MCHC: 34 g/dL (ref 32.0–36.0)
MCV: 85.9 fL (ref 80.0–100.0)
MPV: 10.9 fL (ref 7.5–12.5)
Monocytes Relative: 8.4 %
Neutro Abs: 5090 cells/uL (ref 1500–7800)
Neutrophils Relative %: 60.6 %
Platelets: 290 10*3/uL (ref 140–400)
RBC: 4.89 10*6/uL (ref 3.80–5.10)
RDW: 12.6 % (ref 11.0–15.0)
Total Lymphocyte: 28.9 %
WBC: 8.4 10*3/uL (ref 3.8–10.8)

## 2018-07-17 LAB — SEDIMENTATION RATE: Sed Rate: 2 mm/h (ref 0–20)

## 2018-07-17 LAB — TSH: TSH: 1 mIU/L

## 2018-07-17 LAB — CK: Total CK: 49 U/L (ref 29–143)

## 2018-07-23 ENCOUNTER — Other Ambulatory Visit: Payer: Self-pay

## 2018-07-23 ENCOUNTER — Ambulatory Visit (HOSPITAL_BASED_OUTPATIENT_CLINIC_OR_DEPARTMENT_OTHER)
Admission: RE | Admit: 2018-07-23 | Discharge: 2018-07-23 | Disposition: A | Discharge status: Home / Self Care | Payer: BLUE CROSS/BLUE SHIELD | Source: Ambulatory Visit | Attending: Family Medicine | Admitting: Family Medicine

## 2018-07-23 DIAGNOSIS — M542 Cervicalgia: Secondary | ICD-10-CM

## 2018-07-26 ENCOUNTER — Encounter: Payer: Self-pay | Admitting: Family Medicine

## 2018-08-10 ENCOUNTER — Encounter: Payer: Self-pay | Admitting: Family Medicine

## 2018-08-10 ENCOUNTER — Other Ambulatory Visit: Payer: Self-pay

## 2018-08-10 ENCOUNTER — Ambulatory Visit (INDEPENDENT_AMBULATORY_CARE_PROVIDER_SITE_OTHER): Payer: BC Managed Care – PPO | Admitting: Family Medicine

## 2018-08-10 VITALS — BP 114/77 | HR 96 | Temp 98.0°F | Resp 16 | Ht 67.0 in | Wt 212.6 lb

## 2018-08-10 DIAGNOSIS — M797 Fibromyalgia: Secondary | ICD-10-CM | POA: Diagnosis not present

## 2018-08-10 MED ORDER — TRAMADOL HCL 50 MG PO TABS: ORAL_TABLET | ORAL | 1 refills | Status: DC

## 2018-08-10 MED ORDER — PREGABALIN 100 MG PO CAPS: 100.0000 mg | ORAL_CAPSULE | Freq: Two times a day (BID) | ORAL | 1 refills | Status: DC

## 2018-08-10 NOTE — Telephone Encounter (Signed)
Mild degenerative arthritis changes in cervical spine on x-ray. PT is fine -->see where she wants to go and pls order PT referral (Dx #1 is chronic neck pain and #2 is cervical spondylosis.-thx

## 2018-08-10 NOTE — Progress Notes (Signed)
OFFICE VISIT  08/10/2018   CC:  Chief Complaint  Patient presents with  . Follow-up    fibromyalgia   HPI:    Patient is a 53 y.o. Caucasian female who presents for 3 wks f/u fibromyalgia. Started trial of lyrica last visit, 50mg  bid w/instructions to titrate up to 100 mg bid. I continued her on tramadol prn. Neck pain has been a prominent feature so I obtained plain film C spine--see results below. I obtained some basic labs--all normal (see lab section).  C spine complete, 4 view on 07/23/18: IMPRESSION: Early C5-6 neural foraminal narrowing due to uncovertebral spurring and facet disease. No acute bony abnormality.  Interim hx: Feels some improvement at this time.  She is in lyrica 100 mg bid.   Massage therapist could tell a difference in muscles of neck and back. Some mild sleepiness side effect from lyrica but not bad. Tramadol 1-2 bid most days-->taking 1 instead of 2 b/c of constipation side effect. Sleeping well.  No change in mood or anxiety levels--doing well.  Past Medical History:  Diagnosis Date  . Adult ADHD 06/2017  . DDD (degenerative disc disease), cervical 07/2018   VERY mild C5-6, right  . GAD (generalized anxiety disorder)    with panic attacks  . History of abnormal Pap smear    Repeats are always normal per pt (has GYN MD)  . History of depression    Lexapro helped for a while, Pristique started 04/2011  . Intercostal muscle tear 11/2013   R side  . Migraines   . Myofascial pain 2020   upper back/neck  . Subclinical hyperthyroidism 2010   Some low TSHs and normal FT4 and T3  . Vitamin D deficiency 11/2011    Past Surgical History:  Procedure Laterality Date  . Unremarkable.      Outpatient Medications Prior to Visit  Medication Sig Dispense Refill  . amphetamine-dextroamphetamine (ADDERALL) 15 MG tablet Take 1 tablet by mouth 2 (two) times daily. 60 tablet 0  . desvenlafaxine (PRISTIQ) 100 MG 24 hr tablet TAKE 1 TABLET BY MOUTH EVERY DAY  90 tablet 0  . Multiple Vitamins-Calcium (ONE-A-DAY WOMENS PO) Take by mouth. Take 1 daily.    Marland Kitchen. zolmitriptan (ZOMIG) 5 MG tablet TAKE 1 TABLET BY MOUTH AS NEEDED FOR MIGRAINE. 8 tablet 2  . pregabalin (LYRICA) 50 MG capsule 1 tab po bid x 15d, then increase to 2 tabs po bid 90 capsule 0  . traMADol (ULTRAM) 50 MG tablet 1-2 tabs po bid prn pain 120 tablet 0  . cyclobenzaprine (FLEXERIL) 5 MG tablet 1-2 tabs po tid prn (Patient not taking: Reported on 07/16/2018) 30 tablet 3  . valACYclovir (VALTREX) 1000 MG tablet 2 tabs po q12h x 2 doses at onset of outbreak of cold sores (Patient not taking: Reported on 07/16/2018) 20 tablet 0   No facility-administered medications prior to visit.     No Known Allergies  ROS As per HPI  PE: Blood pressure 114/77, pulse 96, temperature 98 F (36.7 C), temperature source Temporal, resp. rate 16, height 5\' 7"  (1.702 m), weight 212 lb 9.6 oz (96.4 kg), SpO2 99 %. Gen: Alert, well appearing.  Patient is oriented to person, place, time, and situation. AFFECT: pleasant, lucid thought and speech. CV: RRR, no m/r/g.   LUNGS: CTA bilat, nonlabored resps, good aeration in all lung fields. No trigger points in back or neck today.   LABS:    Chemistry      Component Value Date/Time  NA 138 07/16/2018 1623   K 4.4 07/16/2018 1623   CL 104 07/16/2018 1623   CO2 25 07/16/2018 1623   BUN 14 07/16/2018 1623   CREATININE 0.72 07/16/2018 1623      Component Value Date/Time   CALCIUM 9.9 07/16/2018 1623   ALKPHOS 78 03/05/2015 1549   AST 24 07/16/2018 1623   ALT 32 (H) 07/16/2018 1623   BILITOT 0.5 07/16/2018 1623     Lab Results  Component Value Date   WBC 8.4 07/16/2018   HGB 14.3 07/16/2018   HCT 42.0 07/16/2018   MCV 85.9 07/16/2018   PLT 290 07/16/2018   Lab Results  Component Value Date   ESRSEDRATE 2 07/16/2018   Lab Results  Component Value Date   TSH 1.00 07/16/2018   CPK total= 49 (normal) on 07/16/18  IMPRESSION AND  PLAN:  Fibromyalgia syndrome: improved on 100 mg bid lyrica and pretty regular dosing of tramadol 50mg  1-2 bid. Continue 100 mg bid lyrica to see if side effect of mild sedation wears off over the next month. We can then increase dose IF NEEDED at that time. RF'd lyrica and tramadol today (tramadol 50mg , 1-2 bid prn, #120, RF x 1). She'll need renewal of CSC and updated UDS at next f/u visit.  An After Visit Summary was printed and given to the patient.  FOLLOW UP: Return in about 4 weeks (around 09/07/2018) for f/u fibromyalgia.  Signed:  Crissie Sickles, MD           08/10/2018

## 2018-09-13 ENCOUNTER — Other Ambulatory Visit: Payer: Self-pay

## 2018-09-13 ENCOUNTER — Encounter: Payer: Self-pay | Admitting: Family Medicine

## 2018-09-13 ENCOUNTER — Ambulatory Visit: Payer: BC Managed Care – PPO | Admitting: Family Medicine

## 2018-09-13 VITALS — BP 107/74 | HR 98 | Temp 98.0°F | Resp 16 | Ht 67.0 in | Wt 208.8 lb

## 2018-09-13 DIAGNOSIS — M797 Fibromyalgia: Secondary | ICD-10-CM | POA: Diagnosis not present

## 2018-09-13 DIAGNOSIS — N951 Menopausal and female climacteric states: Secondary | ICD-10-CM

## 2018-09-13 MED ORDER — PREGABALIN 150 MG PO CAPS: 150.0000 mg | ORAL_CAPSULE | Freq: Two times a day (BID) | ORAL | 1 refills | Status: DC

## 2018-09-13 MED ORDER — ESTRADIOL 0.5 MG PO TABS: 0.5000 mg | ORAL_TABLET | Freq: Every day | ORAL | 1 refills | Status: DC

## 2018-09-13 NOTE — Progress Notes (Signed)
OFFICE VISIT  09/13/2018   CC:  Chief Complaint  Patient presents with  . Follow-up    fibromyalgia   HPI:    Patient is a 53 y.o. Caucasian female who presents for 1 mo f/u fibromyalgia, trigger points. A/P as of last f/u visit:  "Fibromyalgia syndrome: improved on 100 mg bid lyrica and pretty regular dosing of tramadol 50mg  1-2 bid. Continue 100 mg bid lyrica to see if side effect of mild sedation wears off over the next month. We can then increase dose IF NEEDED at that time. RF'd lyrica and tramadol today (tramadol 50mg , 1-2 bid prn, #120, RF x 1). She'll need renewal of CSC and updated UDS at next f/u visit."  I referred her for PT (C spine arthritis/pain) shortly after our last visit as well-->she ended up not going to PT b/c she feels like she does well with stretching at home.  Still doing massage therapy.  Interim hx:  She's not sure if lyrica helping.  When she stopped her tramadol-->back and neck pain returned. Taking tramadol about once per day.  Flexeril no help except sedating (too much).  Says she is having lots of problems with hot flashes all the time, making her miserable. Menses are occurring less than monthly frequency lately.  ROS: no n/v, no CP, no SOB, no wheezing, no cough, no dizziness, no HAs, no rashes, no melena/hematochezia.  No polyuria or polydipsia.    Past Medical History:  Diagnosis Date  . Adult ADHD 06/2017  . DDD (degenerative disc disease), cervical 07/2018   VERY mild C5-6, right  . GAD (generalized anxiety disorder)    with panic attacks  . History of abnormal Pap smear    Repeats are always normal per pt (has GYN MD)  . History of depression    Lexapro helped for a while, Pristique started 04/2011  . Intercostal muscle tear 11/2013   R side  . Migraines   . Myofascial pain 2020   upper back/neck  . Subclinical hyperthyroidism 2010   Some low TSHs and normal FT4 and T3  . Vitamin D deficiency 11/2011    Past Surgical History:   Procedure Laterality Date  . Unremarkable.      Outpatient Medications Prior to Visit  Medication Sig Dispense Refill  . amphetamine-dextroamphetamine (ADDERALL) 15 MG tablet Take 1 tablet by mouth 2 (two) times daily. 60 tablet 0  . desvenlafaxine (PRISTIQ) 100 MG 24 hr tablet TAKE 1 TABLET BY MOUTH EVERY DAY 90 tablet 0  . Multiple Vitamins-Calcium (ONE-A-DAY WOMENS PO) Take by mouth. Take 1 daily.    . traMADol (ULTRAM) 50 MG tablet 1-2 tabs po bid prn pain 120 tablet 1  . zolmitriptan (ZOMIG) 5 MG tablet TAKE 1 TABLET BY MOUTH AS NEEDED FOR MIGRAINE. 8 tablet 2  . pregabalin (LYRICA) 100 MG capsule Take 1 capsule (100 mg total) by mouth 2 (two) times daily. 60 capsule 1  . valACYclovir (VALTREX) 1000 MG tablet 2 tabs po q12h x 2 doses at onset of outbreak of cold sores (Patient not taking: Reported on 07/16/2018) 20 tablet 0  . cyclobenzaprine (FLEXERIL) 5 MG tablet 1-2 tabs po tid prn (Patient not taking: Reported on 07/16/2018) 30 tablet 3   No facility-administered medications prior to visit.     No Known Allergies  ROS As per HPI  PE: Blood pressure 107/74, pulse 98, temperature 98 F (36.7 C), temperature source Temporal, resp. rate 16, height 5\' 7"  (1.702 m), weight 208 lb 12.8  oz (94.7 kg), SpO2 99 %. Gen: Alert, well appearing.  Patient is oriented to person, place, time, and situation. AFFECT: pleasant, lucid thought and speech. Musculo: Mild TTP over SCM mm's, over scapular regions, and over both greater troch regions.  LABS:    Chemistry      Component Value Date/Time   NA 138 07/16/2018 1623   K 4.4 07/16/2018 1623   CL 104 07/16/2018 1623   CO2 25 07/16/2018 1623   BUN 14 07/16/2018 1623   CREATININE 0.72 07/16/2018 1623      Component Value Date/Time   CALCIUM 9.9 07/16/2018 1623   ALKPHOS 78 03/05/2015 1549   AST 24 07/16/2018 1623   ALT 32 (H) 07/16/2018 1623   BILITOT 0.5 07/16/2018 1623     Lab Results  Component Value Date   WBC 8.4 07/16/2018    HGB 14.3 07/16/2018   HCT 42.0 07/16/2018   MCV 85.9 07/16/2018   PLT 290 07/16/2018    IMPRESSION AND PLAN:  1) Fibromyalgia syndrome: not improved on lyrica 100 mg bid. Increase dose to 150mg  bid and give this 1 mo duration and see her back.  Consider pushing dose to 300mg  bid (max) in future.  Continue tramadol prn-she averages 1 pill a day.  Most recent tramadol rx filled was 08/13/18, #120, rx'd by me.  NO suspicious activity in review of PMP AWARE today. CSC renewed today.  2) Menopausal symptoms: hot flashes, menses spacing out some. Start estradiol 0.5mg  tab po qd. Therapeutic expectations and side effect profile of medication discussed today.  Patient's questions answered.  An After Visit Summary was printed and given to the patient.  FOLLOW UP: Return in about 4 weeks (around 10/11/2018) for f/u myalgias/hot flashes.  Signed:  Santiago BumpersPhil Doral Ventrella, MD           09/13/2018

## 2018-09-14 ENCOUNTER — Other Ambulatory Visit: Payer: Self-pay | Admitting: Family Medicine

## 2018-09-27 ENCOUNTER — Other Ambulatory Visit: Payer: Self-pay

## 2018-09-27 MED ORDER — AMPHETAMINE-DEXTROAMPHETAMINE 15 MG PO TABS: 15.0000 mg | ORAL_TABLET | Freq: Two times a day (BID) | ORAL | 0 refills | Status: DC

## 2018-09-27 NOTE — Telephone Encounter (Signed)
RF request for Adderall LOV: 09/13/18 Next ov: 10/14/18 Last written: 06/11/18 (60,0) Last CSC:09/13/18 w/ UDS: 08/27/17  Medication pending, please advise thanks.

## 2018-10-05 ENCOUNTER — Other Ambulatory Visit: Payer: Self-pay | Admitting: Family Medicine

## 2018-10-14 ENCOUNTER — Ambulatory Visit: Payer: BC Managed Care – PPO | Admitting: Family Medicine

## 2018-10-22 ENCOUNTER — Other Ambulatory Visit: Payer: Self-pay | Admitting: Family Medicine

## 2018-11-03 ENCOUNTER — Other Ambulatory Visit: Payer: Self-pay | Admitting: Family Medicine

## 2018-11-05 ENCOUNTER — Ambulatory Visit: Payer: BC Managed Care – PPO | Admitting: Family Medicine

## 2018-11-16 ENCOUNTER — Other Ambulatory Visit: Payer: Self-pay | Admitting: Family Medicine

## 2018-11-16 MED ORDER — AMPHETAMINE-DEXTROAMPHETAMINE 15 MG PO TABS: 15.0000 mg | ORAL_TABLET | Freq: Two times a day (BID) | ORAL | 0 refills | Status: DC

## 2018-11-16 NOTE — Telephone Encounter (Signed)
Refill request  amphetamine-dextroamphetamine (ADDERALL) 15 MG tablet [[id number]   [facility]

## 2018-11-16 NOTE — Telephone Encounter (Signed)
Requesting: Adderall Contract: 09/13/18 UDS: 08/27/17 Last Visit: 09/13/18 Next Visit: 11/17/18 Last Refill: 09/27/18 (60,0)   Please Advise. Medication pending

## 2018-11-16 NOTE — Telephone Encounter (Signed)
Rx sent 

## 2018-11-17 ENCOUNTER — Ambulatory Visit: Payer: BC Managed Care – PPO | Admitting: Family Medicine

## 2018-11-17 ENCOUNTER — Encounter: Payer: Self-pay | Admitting: Family Medicine

## 2018-11-17 ENCOUNTER — Other Ambulatory Visit: Payer: Self-pay

## 2018-11-17 VITALS — BP 122/84 | HR 96 | Temp 98.4°F | Resp 16 | Ht 67.0 in | Wt 215.4 lb

## 2018-11-17 DIAGNOSIS — F988 Other specified behavioral and emotional disorders with onset usually occurring in childhood and adolescence: Secondary | ICD-10-CM

## 2018-11-17 DIAGNOSIS — M797 Fibromyalgia: Secondary | ICD-10-CM | POA: Diagnosis not present

## 2018-11-17 DIAGNOSIS — N951 Menopausal and female climacteric states: Secondary | ICD-10-CM

## 2018-11-17 MED ORDER — NORETHINDRONE-ETH ESTRADIOL 0.5-2.5 MG-MCG PO TABS: 1.0000 | ORAL_TABLET | Freq: Every day | ORAL | 2 refills | Status: DC

## 2018-11-17 MED ORDER — PREGABALIN 150 MG PO CAPS: 150.0000 mg | ORAL_CAPSULE | Freq: Two times a day (BID) | ORAL | 2 refills | Status: DC

## 2018-11-17 NOTE — Progress Notes (Signed)
OFFICE VISIT  11/17/2018   CC:  Chief Complaint  Patient presents with  . Follow-up    myalgia, hot flashes   HPI:    Patient is a 53 y.o. Caucasian female who presents for 1 mo f/u myofascial pain and hot flashes. AP for myofascial pain as of last visit was: "Fibromyalgia syndrome: not improved on lyrica 100 mg bid. Increase dose to 150mg  bid and give this 1 mo duration and see her back.  Consider pushing dose to 300mg  bid (max) in future.  Continue tramadol prn-she averages 1 pill a day.  Most recent tramadol rx filled was 08/13/18, #120, rx'd by me.  NO suspicious activity in review of PMP AWARE today. CSC renewed today".  Additionally, I started her on estrace 0.5mg  qd for her hot flashes, which I felt were a perimenopausal symptom for her.  Fibro: the lyrica increase has helped: now has more time w/out symptoms but still with flare ups. Sx's tolerable.  She wants to continue with this dosing for now.  Still using tramadol prn, esp at night when lying down trying to go to sleep.  Estrogen has helped diminish the number and intensity of her hot flashes significantly.  Still taking adderall and feels like this is helping focus a lot, doing well with "acting herself" at work. Pt states all is going well with the med at current dosing: much improved focus, concentration, task completion.  Less frustration, better multitasking, less impulsivity and restlessness.  Mood is stable. No side effects from the medication.  ROS: no CP, no SOB, no wheezing, no cough, no dizziness, no HAs, no rashes, no melena/hematochezia.  No polyuria or polydipsia.   Can't sleep the last month: no change in evening routine.  Has tried melatonin but only for one night.  Past Medical History:  Diagnosis Date  . Adult ADHD 06/2017  . DDD (degenerative disc disease), cervical 07/2018   VERY mild C5-6, right  . GAD (generalized anxiety disorder)    with panic attacks  . History of abnormal Pap smear    Repeats  are always normal per pt (has GYN MD)  . History of depression    Lexapro helped for a while, Pristique started 04/2011  . Intercostal muscle tear 11/2013   R side  . Migraines   . Myofascial pain 2020   upper back/neck  . Subclinical hyperthyroidism 2010   Some low TSHs and normal FT4 and T3  . Vitamin D deficiency 11/2011    Past Surgical History:  Procedure Laterality Date  . Unremarkable.      Outpatient Medications Prior to Visit  Medication Sig Dispense Refill  . amphetamine-dextroamphetamine (ADDERALL) 15 MG tablet Take 1 tablet by mouth 2 (two) times daily. 60 tablet 0  . desvenlafaxine (PRISTIQ) 100 MG 24 hr tablet TAKE 1 TABLET BY MOUTH EVERY DAY 90 tablet 0  . Multiple Vitamins-Calcium (ONE-A-DAY WOMENS PO) Take by mouth. Take 1 daily.    . traMADol (ULTRAM) 50 MG tablet 1-2 tabs po bid prn pain 120 tablet 1  . valACYclovir (VALTREX) 1000 MG tablet 2 tabs po q12h x 2 doses at onset of outbreak of cold sores 20 tablet 0  . zolmitriptan (ZOMIG) 5 MG tablet TAKE 1 TABLET BY MOUTH AS NEEDED FOR MIGRAINE. 8 tablet 2  . estradiol (ESTRACE) 0.5 MG tablet TAKE 1 TABLET BY MOUTH EVERY DAY 30 tablet 1  . pregabalin (LYRICA) 150 MG capsule Take 1 capsule (150 mg total) by mouth 2 (two) times  daily. 60 capsule 1   No facility-administered medications prior to visit.     No Known Allergies  ROS As per HPI  PE: Blood pressure 122/84, pulse 96, temperature 98.4 F (36.9 C), temperature source Temporal, resp. rate 16, height 5\' 7"  (1.702 m), weight 215 lb 6.4 oz (97.7 kg), SpO2 99 %.  Gen: Alert, well appearing.  Patient is oriented to person, place, time, and situation. AFFECT: pleasant, lucid thought and speech. No further exam today.  LABS:    Chemistry      Component Value Date/Time   NA 138 07/16/2018 1623   K 4.4 07/16/2018 1623   CL 104 07/16/2018 1623   CO2 25 07/16/2018 1623   BUN 14 07/16/2018 1623   CREATININE 0.72 07/16/2018 1623      Component Value  Date/Time   CALCIUM 9.9 07/16/2018 1623   ALKPHOS 78 03/05/2015 1549   AST 24 07/16/2018 1623   ALT 32 (H) 07/16/2018 1623   BILITOT 0.5 07/16/2018 1623     Lab Results  Component Value Date   WBC 8.4 07/16/2018   HGB 14.3 07/16/2018   HCT 42.0 07/16/2018   MCV 85.9 07/16/2018   PLT 290 07/16/2018   Lab Results  Component Value Date   TSH 1.00 07/16/2018    IMPRESSION AND PLAN:  1) Fibromyalgia: improved on lyrica 150 bid but still not optimal. Will give this dosing some more time. It may be causing her some insomnia.  I recommended adding 10mg  melatonin otc qhs. Tramadol still useful for her.  Most recent fill of this was 10/11/18, #120, rx by me.  2) Perimenopausal hot flashes: good response to 0.5mg  estrace. D/c estrace and change to norethindrone/estrogen combo since she still has a uterus. See orders.  3) Adult ADD: The current medical regimen is effective;  continue present plan and medications. No new rx for adderall was needed today. PMP AWARE reviewed today: most recent rx fill for this med was 09/27/18, #60 rx by me. No suspicious activity.  An After Visit Summary was printed and given to the patient.  FOLLOW UP: Return in about 2 months (around 01/17/2019) for annual CPE (fasting).  Signed:  Crissie Sickles, MD           11/17/2018

## 2018-11-25 ENCOUNTER — Other Ambulatory Visit: Payer: Self-pay | Admitting: Family Medicine

## 2018-12-16 ENCOUNTER — Telehealth: Payer: Self-pay | Admitting: Family Medicine

## 2018-12-16 MED ORDER — AMPHETAMINE-DEXTROAMPHETAMINE 15 MG PO TABS: 15.0000 mg | ORAL_TABLET | Freq: Two times a day (BID) | ORAL | 0 refills | Status: DC

## 2018-12-16 NOTE — Telephone Encounter (Signed)
Refill request from patient  amphetamine-dextroamphetamine (ADDERALL) 15 MG tablet [[id number]  Patient requesting more than 30 d/s Patient can be reached at [phone]  [id number] - [place],

## 2018-12-16 NOTE — Telephone Encounter (Signed)
Requesting: adderall Contract: 09/13/18 UDS:08/27/17 Last Visit: 11/17/18 Next Visit: 01/17/19 Last Refill: 11/16/18 (60,0)  Please Advise. Pt would like more than 30 day supply for refill.

## 2018-12-16 NOTE — Telephone Encounter (Signed)
Ok, 90d supply eRx'd

## 2018-12-21 ENCOUNTER — Other Ambulatory Visit: Payer: Self-pay | Admitting: Family Medicine

## 2018-12-21 ENCOUNTER — Other Ambulatory Visit: Payer: Self-pay

## 2018-12-21 MED ORDER — DESVENLAFAXINE SUCCINATE ER 100 MG PO TB24: 100.0000 mg | ORAL_TABLET | Freq: Every day | ORAL | 0 refills | Status: DC

## 2018-12-31 DIAGNOSIS — H524 Presbyopia: Secondary | ICD-10-CM | POA: Diagnosis not present

## 2019-01-03 ENCOUNTER — Other Ambulatory Visit: Payer: Self-pay | Admitting: Family Medicine

## 2019-01-03 NOTE — Telephone Encounter (Signed)
Pt has enough to last until appt on 01/17/19. She was given 30 d/s with 2 refills on 11/17/18

## 2019-01-12 ENCOUNTER — Other Ambulatory Visit: Payer: Self-pay | Admitting: Family Medicine

## 2019-01-12 MED ORDER — DESVENLAFAXINE SUCCINATE ER 100 MG PO TB24: 100.0000 mg | ORAL_TABLET | Freq: Every day | ORAL | 3 refills | Status: DC

## 2019-01-12 NOTE — Telephone Encounter (Signed)
RF request for Desvenlafaxine LOV:11/17/18 Next ov: 01/17/19 CPE Last written: 12/21/18 (30,0)  Pt has enough to last until appt but would like 90 d/s sent to pharmacy before going out of town today. Please advise, thanks. Medication pending

## 2019-01-12 NOTE — Telephone Encounter (Signed)
desvenlafaxine (PRISTIQ) 100 MG 24 hr tablet [[id number]]    Patient request refill for 90 d/s - She is going to be leaving to go out of town tomorrow morning and requests to pick up meds prior to leaving tomorrow.  Patient can be reached at [phone]  Thank you

## 2019-01-12 NOTE — Telephone Encounter (Signed)
Patient was notified.

## 2019-01-12 NOTE — Telephone Encounter (Signed)
OK, done

## 2019-01-17 ENCOUNTER — Encounter: Payer: BC Managed Care – PPO | Admitting: Family Medicine

## 2019-01-18 DIAGNOSIS — Z6831 Body mass index (BMI) 31.0-31.9, adult: Secondary | ICD-10-CM | POA: Diagnosis not present

## 2019-01-18 DIAGNOSIS — Z20828 Contact with and (suspected) exposure to other viral communicable diseases: Secondary | ICD-10-CM | POA: Diagnosis not present

## 2019-02-14 ENCOUNTER — Encounter: Payer: Self-pay | Admitting: Family Medicine

## 2019-02-14 DIAGNOSIS — Z0184 Encounter for antibody response examination: Secondary | ICD-10-CM

## 2019-02-14 NOTE — Telephone Encounter (Signed)
Fine, I'll order varicella titer. She can then get shingrix vaccine any time after the blood draw for her varicella titer. She should also take the precautions you told her about. Reassure her that IT IS HIGHLY unlikely that she will get chicken pox illness from contact with a person who has shingles.

## 2019-02-14 NOTE — Telephone Encounter (Signed)
Can patient have lab appt to have her varicella titer checked as well as Shingles vaccine if she would like that? Please advise.

## 2019-02-16 ENCOUNTER — Ambulatory Visit: Payer: Self-pay

## 2019-02-16 ENCOUNTER — Other Ambulatory Visit: Payer: Self-pay

## 2019-02-16 ENCOUNTER — Ambulatory Visit (INDEPENDENT_AMBULATORY_CARE_PROVIDER_SITE_OTHER): Payer: BC Managed Care – PPO | Admitting: Family Medicine

## 2019-02-16 DIAGNOSIS — Z0184 Encounter for antibody response examination: Secondary | ICD-10-CM

## 2019-02-17 ENCOUNTER — Encounter: Payer: Self-pay | Admitting: Family Medicine

## 2019-02-17 LAB — VARICELLA ZOSTER ANTIBODY, IGG: Varicella IgG: 850.5 index

## 2019-02-18 NOTE — Telephone Encounter (Signed)
Contacted patient via Lab Results.

## 2019-02-21 ENCOUNTER — Ambulatory Visit (INDEPENDENT_AMBULATORY_CARE_PROVIDER_SITE_OTHER): Payer: BC Managed Care – PPO | Admitting: Family Medicine

## 2019-02-21 ENCOUNTER — Other Ambulatory Visit: Payer: Self-pay

## 2019-02-21 DIAGNOSIS — Z23 Encounter for immunization: Secondary | ICD-10-CM

## 2019-02-28 ENCOUNTER — Encounter: Payer: Self-pay | Admitting: Family Medicine

## 2019-03-14 ENCOUNTER — Other Ambulatory Visit: Payer: Self-pay | Admitting: Family Medicine

## 2019-03-14 NOTE — Telephone Encounter (Signed)
Requesting: Lyrica Last Visit:11/17/18 Next Visit: advised to f/u Dec 2020 Last Refill:11/17/18 (60,2) verified w/pharmacy that pt has no additional refills  Please Advise. Medication pending

## 2019-04-21 ENCOUNTER — Other Ambulatory Visit: Payer: Self-pay | Admitting: Family Medicine

## 2019-04-21 MED ORDER — AMPHETAMINE-DEXTROAMPHETAMINE 15 MG PO TABS: 15.0000 mg | ORAL_TABLET | Freq: Every day | ORAL | 0 refills | Status: DC

## 2019-04-21 NOTE — Telephone Encounter (Signed)
Requesting: adderall 15mg  Contract: 09/13/18 UDS:08/27/17 Last Visit:11/17/18 Next Visit:advised to f/u 2 mo. CPE Last Refill: 12/16/18 (180,0)  Please Advise. Medication pending. MyChart message sent to schedule appt.

## 2019-04-21 NOTE — Telephone Encounter (Signed)
OK, 1 mo supply erx'd. I see she has appt set for later this month.

## 2019-04-22 NOTE — Telephone Encounter (Signed)
Pt notified via My Chart

## 2019-05-02 ENCOUNTER — Other Ambulatory Visit: Payer: Self-pay

## 2019-05-03 ENCOUNTER — Encounter: Payer: Self-pay | Admitting: Family Medicine

## 2019-05-03 ENCOUNTER — Ambulatory Visit: Payer: BC Managed Care – PPO | Admitting: Family Medicine

## 2019-05-03 VITALS — BP 94/67 | HR 88 | Temp 98.0°F | Resp 16 | Ht 67.0 in | Wt 213.6 lb

## 2019-05-03 DIAGNOSIS — F988 Other specified behavioral and emotional disorders with onset usually occurring in childhood and adolescence: Secondary | ICD-10-CM | POA: Diagnosis not present

## 2019-05-03 DIAGNOSIS — Z23 Encounter for immunization: Secondary | ICD-10-CM | POA: Diagnosis not present

## 2019-05-03 DIAGNOSIS — F411 Generalized anxiety disorder: Secondary | ICD-10-CM | POA: Diagnosis not present

## 2019-05-03 DIAGNOSIS — Z79899 Other long term (current) drug therapy: Secondary | ICD-10-CM | POA: Diagnosis not present

## 2019-05-03 DIAGNOSIS — F3342 Major depressive disorder, recurrent, in full remission: Secondary | ICD-10-CM

## 2019-05-03 DIAGNOSIS — M797 Fibromyalgia: Secondary | ICD-10-CM

## 2019-05-03 DIAGNOSIS — N951 Menopausal and female climacteric states: Secondary | ICD-10-CM

## 2019-05-03 NOTE — Addendum Note (Signed)
Addended by: Emi Holes D on: 05/03/2019 09:48 AM   Modules accepted: Orders

## 2019-05-03 NOTE — Progress Notes (Signed)
OFFICE VISIT  05/03/2019   CC:  Chief Complaint  Patient presents with  . Follow-up    RCI, pt is fasting   HPI:    Patient is a 54 y.o. Caucasian female who presents for 5 mo f/u adult ADD, fibromyalgia, and menopausal hot flashes. A/P as of last visit: "1) Fibromyalgia: improved on lyrica 150 bid but still not optimal. Will give this dosing some more time. It may be causing her some insomnia.  I recommended adding 10mg  melatonin otc qhs. Tramadol still useful for her.  Most recent fill of this was 10/11/18, #120, rx by me.  2) Perimenopausal hot flashes: good response to 0.5mg  estrace. D/c estrace and change to norethindrone/estrogen combo since she still has a uterus. See orders.  3) Adult ADD: The current medical regimen is effective;  continue present plan and medications. No new rx for adderall was needed today. PMP AWARE reviewed today: most recent rx fill for this med was 09/27/18, #60 rx by me. No suspicious activity."  Interim hx: Doing pretty well. Business is going fine. Myofascial pain in tops of shoulders and in neck bothering her more, this is cyclical. Stretching regularly, getting formal massages every 2-3 wks. Dry needling (one injection) in the past--equivocal results, pt did not want to try same procedure again at that time.  Tramadol use very sparingly.  Pt states all is going well with the med at current dosing: much improved focus, concentration, task completion.  Less frustration, better multitasking, less impulsivity and restlessness.  Mood is stable. No side effects from the medication.  If takes a dose later in day it can cause  Taking lyrica 150mg  bid, feels like she is wanting to stick with current dosing.  PMP AWARE reviewed today: most recent rx for adderrall 15mg  was filled 04/21/19, # 30, rx by me. Most recent fill of rx for lyrica was 03/14/19, #180, rx by me. Most recent fill of tramadol was 10/11/18, #120, rx by me. No red flags. CSC in EMR ,  dated 09/13/18.  Anxiety not bad lately, mood down some lately. Taking pristique 100 mg qd.  She stopped the estr/prog pill b/c and she has not felt a signif return of hot flashes problems.  ROS: no fevers, no CP, no SOB, no wheezing, no cough, no dizziness, no HAs, no rashes, no melena/hematochezia.  No polyuria or polydipsia.  No arthralgias.  No focal weakness, paresthesias, or tremors.  No acute vision or hearing abnormalities. No n/v/d or abd pain.  No palpitations.     Past Medical History:  Diagnosis Date  . Adult ADHD 06/2017  . DDD (degenerative disc disease), cervical 07/2018   VERY mild C5-6, right  . GAD (generalized anxiety disorder)    with panic attacks  . History of abnormal Pap smear    Repeats are always normal per pt (has GYN MD)  . History of depression    Lexapro helped for a while, Pristique started 04/2011  . Intercostal muscle tear 11/2013   R side  . Migraines   . Myofascial pain 2020   upper back/neck  . Subclinical hyperthyroidism 2010   Some low TSHs and normal FT4 and T3  . Vitamin D deficiency 11/2011    Past Surgical History:  Procedure Laterality Date  . Unremarkable.      Outpatient Medications Prior to Visit  Medication Sig Dispense Refill  . amphetamine-dextroamphetamine (ADDERALL) 15 MG tablet Take 1 tablet by mouth daily. 30 tablet 0  . desvenlafaxine (PRISTIQ)  100 MG 24 hr tablet Take 1 tablet (100 mg total) by mouth daily. 90 tablet 3  . Multiple Vitamins-Calcium (ONE-A-DAY WOMENS PO) Take by mouth. Take 1 daily.    . pregabalin (LYRICA) 150 MG capsule TAKE 1 CAPSULE BY MOUTH TWICE A DAY 180 capsule 0  . traMADol (ULTRAM) 50 MG tablet 1-2 tabs po bid prn pain 120 tablet 1  . valACYclovir (VALTREX) 1000 MG tablet 2 tabs po q12h x 2 doses at onset of outbreak of cold sores 20 tablet 0  . zolmitriptan (ZOMIG) 5 MG tablet TAKE 1 TABLET BY MOUTH AS NEEDED FOR MIGRAINE. 8 tablet 2  . norethindrone-ethinyl estradiol (FEMHRT LOW DOSE) 0.5-2.5  MG-MCG tablet Take 1 tablet by mouth daily. (Patient not taking: Reported on 05/03/2019) 30 tablet 2   No facility-administered medications prior to visit.    No Known Allergies  ROS As per HPI  PE: Blood pressure 94/67, pulse 88, temperature 98 F (36.7 C), temperature source Temporal, resp. rate 16, height 5\' 7"  (1.702 m), weight 213 lb 9.6 oz (96.9 kg), SpO2 98 %. Body mass index is 33.45 kg/m.  Gen: Alert, well appearing.  Patient is oriented to person, place, time, and situation. AFFECT: pleasant, lucid thought and speech. No further exam today.  LABS:  Lab Results  Component Value Date   TSH 1.00 07/16/2018   Lab Results  Component Value Date   WBC 8.4 07/16/2018   HGB 14.3 07/16/2018   HCT 42.0 07/16/2018   MCV 85.9 07/16/2018   PLT 290 07/16/2018   Lab Results  Component Value Date   CREATININE 0.72 07/16/2018   BUN 14 07/16/2018   NA 138 07/16/2018   K 4.4 07/16/2018   CL 104 07/16/2018   CO2 25 07/16/2018   Lab Results  Component Value Date   ALT 32 (H) 07/16/2018   AST 24 07/16/2018   ALKPHOS 78 03/05/2015   BILITOT 0.5 07/16/2018   Lab Results  Component Value Date   CHOL 211 (H) 01/25/2013   Lab Results  Component Value Date   HDL 47.90 01/25/2013   No results found for: [facility] Lab Results  Component Value Date   TRIG 73.0 01/25/2013   Lab Results  Component Value Date   CHOLHDL 4 01/25/2013    IMPRESSION AND PLAN:  1) Fibromyalgia; doing fairly well.  No med changes.  Will check into PT options for another try of Dry Needling therapy.  2) Adult ADD: doing fine.  No new rx's needed for her adderall today. UDS today.  CSC UTD.  3) Anx/dep: doing fairly well considering covid 19 restrictions and political atmosphere. Desires no changes in meds.  No new rx's given today.  4) Menopausal hot flashes: these seem to have abated for the most part, even since her self-discontinuation of Femhrt low dose.  An After Visit Summary was  printed and given to the patient.  FOLLOW UP: Return in about 6 months (around 11/03/2019) for annual CPE (fasting).  Signed:  11/05/2019, MD           05/03/2019

## 2019-05-04 ENCOUNTER — Other Ambulatory Visit: Payer: Self-pay

## 2019-05-04 DIAGNOSIS — M7918 Myalgia, other site: Secondary | ICD-10-CM

## 2019-05-04 DIAGNOSIS — M797 Fibromyalgia: Secondary | ICD-10-CM

## 2019-05-05 LAB — PAIN MGMT, PROFILE 8 W/CONF, U
6 Acetylmorphine: NEGATIVE ng/mL
Alcohol Metabolites: NEGATIVE ng/mL (ref <500)
Amphetamine: 854 ng/mL
Amphetamines: POSITIVE ng/mL
Benzodiazepines: NEGATIVE ng/mL
Buprenorphine, Urine: NEGATIVE ng/mL
Cocaine Metabolite: NEGATIVE ng/mL
Creatinine: 221.7 mg/dL
MDMA: NEGATIVE ng/mL
Marijuana Metabolite: NEGATIVE ng/mL
Methamphetamine: NEGATIVE ng/mL
Opiates: NEGATIVE ng/mL
Oxidant: NEGATIVE ug/mL
Oxycodone: NEGATIVE ng/mL
pH: 6.7 (ref 4.5–9.0)

## 2019-05-20 ENCOUNTER — Ambulatory Visit: Payer: BC Managed Care – PPO

## 2019-05-23 ENCOUNTER — Encounter: Payer: Self-pay | Admitting: Family Medicine

## 2019-05-23 ENCOUNTER — Other Ambulatory Visit: Payer: Self-pay

## 2019-05-23 MED ORDER — AMPHETAMINE-DEXTROAMPHETAMINE 15 MG PO TABS: 15.0000 mg | ORAL_TABLET | Freq: Every day | ORAL | 0 refills | Status: DC

## 2019-05-23 NOTE — Telephone Encounter (Signed)
OK, I did adderall rx's for each of the next 3 months today.

## 2019-05-23 NOTE — Telephone Encounter (Signed)
Requesting: adderall Contract:09/13/18 UDS:05/03/19 Last Visit:05/03/19 Next Visit: advised to f/u 6 mo. Last Refill:04/21/19(30,0)  Please Advise. Medication pending

## 2019-06-01 ENCOUNTER — Encounter: Payer: Self-pay | Admitting: Family Medicine

## 2019-06-01 NOTE — Telephone Encounter (Signed)
Pt last seen 05/03/19 with NKDA. Any other allergy medication recommendations?

## 2019-06-01 NOTE — Telephone Encounter (Signed)
Try otc allegra D 24h, take once every morning during allergy season. Also, take otc flonase, 2 sprays each nostril once daily. Finally, for relief of eyes itching and running buy otc generic zaditor eye drops, take as directed on packaging. ---thx

## 2019-06-09 ENCOUNTER — Telehealth: Payer: Self-pay

## 2019-06-09 NOTE — Telephone Encounter (Signed)
Sent as FYI.  Received fax notification from [facility] that pt has been left several messages to schedule with no return call.

## 2019-06-09 NOTE — Telephone Encounter (Signed)
Noted  

## 2019-06-21 ENCOUNTER — Encounter: Payer: Self-pay | Admitting: Family Medicine

## 2019-06-26 ENCOUNTER — Other Ambulatory Visit: Payer: Self-pay | Admitting: Family Medicine

## 2019-06-27 NOTE — Telephone Encounter (Signed)
Requesting: Pregablin Last Visit:05/03/19 Next Visit: advised to f/u 6 mo. Last Refill:03/14/19(180,0)  Please Advise. Medication pending

## 2019-08-09 ENCOUNTER — Other Ambulatory Visit: Payer: Self-pay | Admitting: Family Medicine

## 2019-08-09 NOTE — Telephone Encounter (Signed)
Requesting: tramadol Contract:09/13/18 UDS:05/03/19 Last Visit:05/03/19 Next Visit:advised to f/u 6 mo. Last Refill:08/10/18(120,1)  Please Advise. Medication pending

## 2019-08-10 ENCOUNTER — Telehealth: Payer: Self-pay

## 2019-08-10 NOTE — Telephone Encounter (Signed)
PA sent via covermymed on 08/10/19   Key: [id number]   Medication: TRAMADOL 50MG    Dx: M79.18, myofascial pain   Per Dr. pt has tried and failed N/A   Waiting for response.    PA approved, will call pharmacy to notify. RF in process.

## 2019-08-19 ENCOUNTER — Other Ambulatory Visit: Payer: Self-pay

## 2019-08-19 MED ORDER — ZOLMITRIPTAN 5 MG PO TABS: ORAL_TABLET | ORAL | 1 refills | Status: DC

## 2019-08-19 NOTE — Telephone Encounter (Signed)
RF request for Zomig 5mg   LOV: 05/03/2019 Next ov: Not scheduled  Last written: #8 x2 refills 10/25/2018  Please advise

## 2019-10-20 ENCOUNTER — Other Ambulatory Visit: Payer: Self-pay | Admitting: Family Medicine

## 2019-12-20 ENCOUNTER — Other Ambulatory Visit: Payer: Self-pay | Admitting: Family Medicine

## 2020-01-13 ENCOUNTER — Other Ambulatory Visit: Payer: Self-pay | Admitting: Family Medicine

## 2020-01-13 NOTE — Telephone Encounter (Signed)
Requesting: lyrica Contract:09/13/18 UDS: 05/03/19 Last Visit:05/03/19 Next Visit:n/a Last Refill:06/27/19 (180,1)  Please Advise, attempted to call pt to schedule CPE

## 2020-01-16 NOTE — Telephone Encounter (Signed)
Left message for pt to call back for scheduling.

## 2020-01-17 ENCOUNTER — Other Ambulatory Visit: Payer: Self-pay | Admitting: Family Medicine

## 2020-01-18 ENCOUNTER — Other Ambulatory Visit: Payer: Self-pay | Admitting: Family Medicine

## 2020-01-18 MED ORDER — DESVENLAFAXINE SUCCINATE ER 100 MG PO TB24
100.0000 mg | ORAL_TABLET | Freq: Every day | ORAL | 0 refills | Status: DC
Start: 2020-01-18 — End: 2020-02-22

## 2020-01-18 NOTE — Telephone Encounter (Signed)
Rx sent 

## 2020-01-18 NOTE — Telephone Encounter (Signed)
Patient needs refill of Pristiq. States she will run out at the end of the week. She has physical scheduled for 02/22/19 and would like refill enough to get through.

## 2020-01-23 DIAGNOSIS — Z03818 Encounter for observation for suspected exposure to other biological agents ruled out: Secondary | ICD-10-CM | POA: Diagnosis not present

## 2020-02-15 ENCOUNTER — Other Ambulatory Visit: Payer: Self-pay | Admitting: Family Medicine

## 2020-02-15 MED ORDER — ZOLMITRIPTAN 5 MG PO TABS: ORAL_TABLET | ORAL | 1 refills | Status: DC

## 2020-02-15 NOTE — Telephone Encounter (Signed)
Patient requesting refill of Zomig. She has appt 02/22/20 but is currently out of medication.

## 2020-02-15 NOTE — Telephone Encounter (Signed)
RF request for zomig LOV:05/03/19 Next ov: 02/22/20 Last written: 10/20/19 (8,1)  Please advise

## 2020-02-19 ENCOUNTER — Other Ambulatory Visit: Payer: Self-pay | Admitting: Family Medicine

## 2020-02-22 ENCOUNTER — Other Ambulatory Visit: Payer: Self-pay

## 2020-02-22 ENCOUNTER — Encounter: Payer: Self-pay | Admitting: Family Medicine

## 2020-02-22 ENCOUNTER — Ambulatory Visit (INDEPENDENT_AMBULATORY_CARE_PROVIDER_SITE_OTHER): Payer: BC Managed Care – PPO | Admitting: Family Medicine

## 2020-02-22 VITALS — BP 132/81 | HR 94 | Temp 98.2°F | Resp 16 | Wt 223.0 lb

## 2020-02-22 DIAGNOSIS — M7918 Myalgia, other site: Secondary | ICD-10-CM | POA: Diagnosis not present

## 2020-02-22 DIAGNOSIS — Z79899 Other long term (current) drug therapy: Secondary | ICD-10-CM

## 2020-02-22 DIAGNOSIS — F419 Anxiety disorder, unspecified: Secondary | ICD-10-CM | POA: Diagnosis not present

## 2020-02-22 DIAGNOSIS — E0590 Thyrotoxicosis, unspecified without thyrotoxic crisis or storm: Secondary | ICD-10-CM

## 2020-02-22 DIAGNOSIS — Z0000 Encounter for general adult medical examination without abnormal findings: Secondary | ICD-10-CM | POA: Diagnosis not present

## 2020-02-22 DIAGNOSIS — E669 Obesity, unspecified: Secondary | ICD-10-CM

## 2020-02-22 DIAGNOSIS — B009 Herpesviral infection, unspecified: Secondary | ICD-10-CM

## 2020-02-22 DIAGNOSIS — F32A Depression, unspecified: Secondary | ICD-10-CM

## 2020-02-22 DIAGNOSIS — Z1211 Encounter for screening for malignant neoplasm of colon: Secondary | ICD-10-CM

## 2020-02-22 LAB — COMPREHENSIVE METABOLIC PANEL
ALT: 25 U/L (ref 0–35)
AST: 19 U/L (ref 0–37)
Albumin: 4.1 g/dL (ref 3.5–5.2)
Alkaline Phosphatase: 86 U/L (ref 39–117)
BUN: 13 mg/dL (ref 6–23)
CO2: 29 mEq/L (ref 19–32)
Calcium: 9.6 mg/dL (ref 8.4–10.5)
Chloride: 105 mEq/L (ref 96–112)
Creatinine, Ser: 0.72 mg/dL (ref 0.40–1.20)
GFR: 96.28 mL/min (ref >60.00)
Glucose, Bld: 91 mg/dL (ref 70–99)
Potassium: 4.4 mEq/L (ref 3.5–5.1)
Sodium: 140 mEq/L (ref 135–145)
Total Bilirubin: 0.6 mg/dL (ref 0.2–1.2)
Total Protein: 6.5 g/dL (ref 6.0–8.3)

## 2020-02-22 LAB — CBC WITH DIFFERENTIAL/PLATELET
Basophils Absolute: 0.1 10*3/uL (ref 0.0–0.1)
Basophils Relative: 1.2 % (ref 0.0–3.0)
Eosinophils Absolute: 0.2 10*3/uL (ref 0.0–0.7)
Eosinophils Relative: 3 % (ref 0.0–5.0)
HCT: 40.6 % (ref 36.0–46.0)
Hemoglobin: 13.9 g/dL (ref 12.0–15.0)
Lymphocytes Relative: 35.3 % (ref 12.0–46.0)
Lymphs Abs: 2.1 10*3/uL (ref 0.7–4.0)
MCHC: 34.2 g/dL (ref 30.0–36.0)
MCV: 85.8 fl (ref 78.0–100.0)
Monocytes Absolute: 0.6 10*3/uL (ref 0.1–1.0)
Monocytes Relative: 10.5 % (ref 3.0–12.0)
Neutro Abs: 3 10*3/uL (ref 1.4–7.7)
Neutrophils Relative %: 50 % (ref 43.0–77.0)
Platelets: 213 10*3/uL (ref 150.0–400.0)
RBC: 4.73 Mil/uL (ref 3.87–5.11)
RDW: 13 % (ref 11.5–15.5)
WBC: 6 10*3/uL (ref 4.0–10.5)

## 2020-02-22 LAB — LIPID PANEL
Cholesterol: 209 mg/dL — ABNORMAL HIGH (ref 0–200)
HDL: 43.6 mg/dL (ref >39.00)
LDL Cholesterol: 137 mg/dL — ABNORMAL HIGH (ref 0–99)
NonHDL: 165.33
Total CHOL/HDL Ratio: 5
Triglycerides: 140 mg/dL (ref 0.0–149.0)
VLDL: 28 mg/dL (ref 0.0–40.0)

## 2020-02-22 LAB — TSH: TSH: 0.77 u[IU]/mL (ref 0.35–4.50)

## 2020-02-22 MED ORDER — VALACYCLOVIR HCL 1 G PO TABS
ORAL_TABLET | ORAL | 1 refills | Status: DC
Start: 2020-02-22 — End: 2022-03-07

## 2020-02-22 MED ORDER — AMPHETAMINE-DEXTROAMPHETAMINE 20 MG PO TABS: 20.0000 mg | ORAL_TABLET | Freq: Every day | ORAL | 0 refills | Status: DC

## 2020-02-22 MED ORDER — TRAMADOL HCL 50 MG PO TABS: ORAL_TABLET | ORAL | 1 refills | Status: DC

## 2020-02-22 MED ORDER — PREGABALIN 150 MG PO CAPS: 150.0000 mg | ORAL_CAPSULE | Freq: Two times a day (BID) | ORAL | 5 refills | Status: DC

## 2020-02-22 MED ORDER — DESVENLAFAXINE SUCCINATE ER 100 MG PO TB24: 100.0000 mg | ORAL_TABLET | Freq: Every day | ORAL | 0 refills | Status: DC

## 2020-02-22 NOTE — Progress Notes (Signed)
Office Note 02/22/2020  CC: No chief complaint on file.  HPI:  Heidi Leonard is a 55 y.o. White female who is here for annual health maintenance exam and f/u fibromyalgia, anx/dep, and adult ADD. A/P as of last visit: "1) Fibromyalgia; doing fairly well.  No med changes.  Will check into PT options for another try of Dry Needling therapy.  2) Adult ADD: doing fine.  No new rx's needed for her adderall today. UDS today.  CSC UTD.  3) Anx/dep: doing fairly well considering covid 19 restrictions and political atmosphere. Desires no changes in meds.  No new rx's given today.  4) Menopausal hot flashes: these seem to have abated for the most part, even since her self-discontinuation of Femhrt low dose."  INTERIM HX: Feeling fine today. Chronic myofascial pain still quite bothersome, takes tramadol 1 tab nightly and it helps some. Her anxiety and irritability/impulsivity is at higher level lately, still feels like pristiq helps some.  ADD: Pt states all was going well with the med at current dosing BUT efficacy gradually waned over the last year or so and she eventually stopped it approx 4-5 mo ago.  Took adderall 15 IR qam was her dose and lasted long enough for her needs.  She much improved focus, concentration, task completion.  Less frustration, better multitasking, less impulsivity and restlessness.  Mood is stable.   No side effects from the medication.   PMP AWARE reviewed today: most recent rx for pregabalin was filled 01/15/20, # 60, rx by me. Most recent rx for tramadol was filled 12/05/19, #120, rx by me. Most recent rx for adderall was filled 05/24/19, #30, rx by me. No red flags.  Past Medical History:  Diagnosis Date  . Adult ADHD 06/2017  . DDD (degenerative disc disease), cervical 07/2018   VERY mild C5-6, right  . GAD (generalized anxiety disorder)    with panic attacks  . History of abnormal Pap smear    Repeats are always normal per pt (has GYN MD)  .  History of depression    Lexapro helped for a while, Pristique started 04/2011  . Intercostal muscle tear 11/2013   R side  . Migraines   . Myofascial pain 2020   upper back/neck  . Subclinical hyperthyroidism 2010   Some low TSHs and normal FT4 and T3  . Vitamin D deficiency 11/2011    Past Surgical History:  Procedure Laterality Date  . Unremarkable.      Family History  Problem Relation Age of Onset  . Cancer Maternal Aunt   . Healthy Mother   . Other Father        Unknown Health status.  . Hypertension Sister     Social History   Socioeconomic History  . Marital status: Married    Spouse name: Not on file  . Number of children: Not on file  . Years of education: Not on file  . Highest education level: Not on file  Occupational History  . Not on file  Tobacco Use  . Smoking status: Former Games developer  . Smokeless tobacco: Never Used  . Tobacco comment: Quit approx [5] years ago.  Substance and Sexual Activity  . Alcohol use: Yes  . Drug use: No  . Sexual activity: Yes  Other Topics Concern  . Not on file  Social History Narrative   Married, 3 step children.   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     Originally from [place].  Former cig smoker, quit 2009.  Occ alcohol.  No drugs.   Works out with [employer] and walks several days a week.      Social Determinants of Health   Financial Resource Strain: Not on file  Food Insecurity: Not on file  Transportation Needs: Not on file  Physical Activity: Not on file  Stress: Not on file  Social Connections: Not on file  Intimate Partner Violence: Not on file    Outpatient Medications Prior to Visit  Medication Sig Dispense Refill  . Multiple Vitamins-Calcium (ONE-A-DAY WOMENS PO) Take by mouth. Take 1 daily.    Marland Kitchen zolmitriptan (ZOMIG) 5 MG tablet 1 tab po at onset of migraine HA, may repeat dose in 2 hours if needed.  Max in 24h is 10 mg. 8 tablet 1  . amphetamine-dextroamphetamine  (ADDERALL) 15 MG tablet Take 1 tablet by mouth daily. 30 tablet 0  . desvenlafaxine (PRISTIQ) 100 MG 24 hr tablet Take 1 tablet (100 mg total) by mouth daily. 35 tablet 0  . pregabalin (LYRICA) 150 MG capsule TAKE 1 CAPSULE BY MOUTH TWICE A DAY 60 capsule 0  . traMADol (ULTRAM) 50 MG tablet TAKE 1 TO 2 TABLETS BY MOUTH TWICE A DAY AS NEEDED FOR PAIN 120 tablet 1  . valACYclovir (VALTREX) 1000 MG tablet 2 tabs po q12h x 2 doses at onset of outbreak of cold sores 20 tablet 0   No facility-administered medications prior to visit.    No Known Allergies  ROS Review of Systems  Constitutional: Negative for appetite change, chills, fatigue and fever.  HENT: Negative for congestion, dental problem, ear pain and sore throat.   Eyes: Negative for discharge, redness and visual disturbance.  Respiratory: Negative for cough, chest tightness, shortness of breath and wheezing.   Cardiovascular: Negative for chest pain, palpitations and leg swelling.  Gastrointestinal: Negative for abdominal pain, blood in stool, diarrhea, nausea and vomiting.  Genitourinary: Negative for difficulty urinating, dysuria, flank pain, frequency, hematuria and urgency.  Musculoskeletal: Negative for arthralgias, back pain, joint swelling, myalgias and neck stiffness.  Skin: Negative for pallor and rash.  Neurological: Negative for dizziness, speech difficulty, weakness and headaches.  Hematological: Negative for adenopathy. Does not bruise/bleed easily.  Psychiatric/Behavioral: Negative for confusion and sleep disturbance. The patient is not nervous/anxious.      PE; Vitals with BMI 02/22/2020 05/03/2019 11/17/2018  Height - 5\' 7"  5\' 7"   Weight 223 lbs 213 lbs 10 oz 215 lbs 6 oz  BMI - 33.45 33.73  Systolic 132 94 122  Diastolic 81 67 84  Pulse 94 88 96   Exam chaperoned by , CMA  Gen: Alert, well appearing.  Patient is oriented to person, place, time, and situation. AFFECT: pleasant, lucid thought and  speech. ENT: Ears: EACs clear, normal epithelium.  TMs with good light reflex and landmarks bilaterally.  Eyes: no injection, icteris, swelling, or exudate.  EOMI, PERRLA. Nose: no drainage or turbinate edema/swelling.  No injection or focal lesion.  Mouth: lips without lesion/swelling.  Oral mucosa pink and moist.  Dentition intact and without obvious caries or gingival swelling.  Oropharynx without erythema, exudate, or swelling.  Neck: supple/nontender.  No LAD, mass, or TM.  Carotid pulses 2+ bilaterally, without bruits. CV: RRR, no m/r/g.   LUNGS: CTA bilat, nonlabored resps, good aeration in all lung fields. ABD: soft, NT, ND, BS normal.  No hepatospenomegaly or mass.  No bruits. EXT: no clubbing, cyanosis, or edema.  Musculoskeletal: no joint swelling, erythema,  warmth, or tenderness.  ROM of all joints intact. Skin - no sores or suspicious lesions or rashes or color changes   Pertinent labs:  Lab Results  Component Value Date   TSH 1.00 07/16/2018   Lab Results  Component Value Date   WBC 8.4 07/16/2018   HGB 14.3 07/16/2018   HCT 42.0 07/16/2018   MCV 85.9 07/16/2018   PLT 290 07/16/2018   Lab Results  Component Value Date   CREATININE 0.72 07/16/2018   BUN 14 07/16/2018   NA 138 07/16/2018   K 4.4 07/16/2018   CL 104 07/16/2018   CO2 25 07/16/2018   Lab Results  Component Value Date   ALT 32 (H) 07/16/2018   AST 24 07/16/2018   ALKPHOS 78 03/05/2015   BILITOT 0.5 07/16/2018   Lab Results  Component Value Date   CHOL 211 (H) 01/25/2013   Lab Results  Component Value Date   HDL 47.90 01/25/2013   No results found for: [facility] Lab Results  Component Value Date   TRIG 73.0 01/25/2013   Lab Results  Component Value Date   CHOLHDL 4 01/25/2013    ASSESSMENT AND PLAN:   1) Adult ADD: waning response after being on 15mg  IR adderall for approx 1 yr->will inc dose to 15mg  qd, #30.  2) Myofascial pain syndrome: fairly stable, does reasonably well with  lyrica 150mg  bid and tramadil 50mg  qd->continue this.  3) Anx/dep: other than periods of normal life-induced flares she is overall doing well on pristiq 100 mg qd long term.  4) Health maintenance exam: Reviewed age and gender appropriate health maintenance issues (prudent diet, regular exercise, health risks of tobacco and excessive alcohol, use of seatbelts, fire alarms in home, use of sunscreen).  Also reviewed age and gender appropriate health screening as well as vaccine recommendations. Vaccines: flu->declines.  Covid 19->declines.  Otherwise UTD. Labs: fasting HP Cervical ca screening: Dr. Breast ca screening: last one 2 yrs ago, gets this through GYN. Colon ca screening: average risk patient= as per latest guidelines, start screening at any time now->cologuard.  An After Visit Summary was printed and given to the patient.  FOLLOW UP:  Return in about 6 months (around 08/21/2020) for routine chronic illness f/u.  Signed:  , MD           02/22/2020

## 2020-02-22 NOTE — Patient Instructions (Signed)
Health Maintenance, Female Adopting a healthy lifestyle and getting preventive care are important in promoting health and wellness. Ask your health care provider about:  The right schedule for you to have regular tests and exams.  Things you can do on your own to prevent diseases and keep yourself healthy. What should I know about diet, weight, and exercise? Eat a healthy diet  Eat a diet that includes plenty of vegetables, fruits, low-fat dairy products, and lean protein.  Do not eat a lot of foods that are high in solid fats, added sugars, or sodium.   Maintain a healthy weight Body mass index (BMI) is used to identify weight problems. It estimates body fat based on height and weight. Your health care provider can help determine your BMI and help you achieve or maintain a healthy weight. Get regular exercise Get regular exercise. This is one of the most important things you can do for your health. Most adults should:  Exercise for at least 150 minutes each week. The exercise should increase your heart rate and make you sweat (moderate-intensity exercise).  Do strengthening exercises at least twice a week. This is in addition to the moderate-intensity exercise.  Spend less time sitting. Even light physical activity can be beneficial. Watch cholesterol and blood lipids Have your blood tested for lipids and cholesterol at 53 years of age, then have this test every 5 years. Have your cholesterol levels checked more often if:  Your lipid or cholesterol levels are high.  You are older than 53 years of age.  You are at high risk for heart disease. What should I know about cancer screening? Depending on your health history and family history, you may need to have cancer screening at various ages. This may include screening for:  Breast cancer.  Cervical cancer.  Colorectal cancer.  Skin cancer.  Lung cancer. What should I know about heart disease, diabetes, and high blood  pressure? Blood pressure and heart disease  High blood pressure causes heart disease and increases the risk of stroke. This is more likely to develop in people who have high blood pressure readings, are of African descent, or are overweight.  Have your blood pressure checked: ? Every 3-5 years if you are 18-39 years of age. ? Every year if you are 40 years old or older. Diabetes Have regular diabetes screenings. This checks your fasting blood sugar level. Have the screening done:  Once every three years after age 40 if you are at a normal weight and have a low risk for diabetes.  More often and at a younger age if you are overweight or have a high risk for diabetes. What should I know about preventing infection? Hepatitis B If you have a higher risk for hepatitis B, you should be screened for this virus. Talk with your health care provider to find out if you are at risk for hepatitis B infection. Hepatitis C Testing is recommended for:  Everyone born from 1945 through 1965.  Anyone with known risk factors for hepatitis C. Sexually transmitted infections (STIs)  Get screened for STIs, including gonorrhea and chlamydia, if: ? You are sexually active and are younger than 53 years of age. ? You are older than 53 years of age and your health care provider tells you that you are at risk for this type of infection. ? Your sexual activity has changed since you were last screened, and you are at increased risk for chlamydia or gonorrhea. Ask your health care provider   if you are at risk.  Ask your health care provider about whether you are at high risk for HIV. Your health care provider may recommend a prescription medicine to help prevent HIV infection. If you choose to take medicine to prevent HIV, you should first get tested for HIV. You should then be tested every 3 months for as long as you are taking the medicine. Pregnancy  If you are about to stop having your period (premenopausal) and  you may become pregnant, seek counseling before you get pregnant.  Take 400 to 800 micrograms (mcg) of folic acid every day if you become pregnant.  Ask for birth control (contraception) if you want to prevent pregnancy. Osteoporosis and menopause Osteoporosis is a disease in which the bones lose minerals and strength with aging. This can result in bone fractures. If you are 65 years old or older, or if you are at risk for osteoporosis and fractures, ask your health care provider if you should:  Be screened for bone loss.  Take a calcium or vitamin D supplement to lower your risk of fractures.  Be given hormone replacement therapy (HRT) to treat symptoms of menopause. Follow these instructions at home: Lifestyle  Do not use any products that contain nicotine or tobacco, such as cigarettes, e-cigarettes, and chewing tobacco. If you need help quitting, ask your health care provider.  Do not use street drugs.  Do not share needles.  Ask your health care provider for help if you need support or information about quitting drugs. Alcohol use  Do not drink alcohol if: ? Your health care provider tells you not to drink. ? You are pregnant, may be pregnant, or are planning to become pregnant.  If you drink alcohol: ? Limit how much you use to 0-1 drink a day. ? Limit intake if you are breastfeeding.  Be aware of how much alcohol is in your drink. In the [place] one drink equals one 12 oz bottle of beer (355 mL), one 5 oz glass of wine (148 mL), or one 1 oz glass of hard liquor (44 mL). General instructions  Schedule regular health, dental, and eye exams.  Stay current with your vaccines.  Tell your health care provider if: ? You often feel depressed. ? You have ever been abused or do not feel safe at home. Summary  Adopting a healthy lifestyle and getting preventive care are important in promoting health and wellness.  Follow your health care provider's instructions about healthy  diet, exercising, and getting tested or screened for diseases.  Follow your health care provider's instructions on monitoring your cholesterol and blood pressure. This information is not intended to replace advice given to you by your health care provider. Make sure you discuss any questions you have with your health care provider. Document Revised: 01/20/2018 Document Reviewed: 01/20/2018 Elsevier Patient Education  2021 Elsevier Inc.  

## 2020-03-07 DIAGNOSIS — Z1211 Encounter for screening for malignant neoplasm of colon: Secondary | ICD-10-CM | POA: Diagnosis not present

## 2020-03-16 ENCOUNTER — Other Ambulatory Visit: Payer: Self-pay | Admitting: Family Medicine

## 2020-03-16 LAB — COLOGUARD
COLOGUARD: NEGATIVE
Cologuard: NEGATIVE

## 2020-03-16 LAB — EXTERNAL GENERIC LAB PROCEDURE: COLOGUARD: NEGATIVE

## 2020-03-27 ENCOUNTER — Encounter: Payer: Self-pay | Admitting: Family Medicine

## 2020-03-27 ENCOUNTER — Other Ambulatory Visit: Payer: Self-pay

## 2020-03-27 MED ORDER — AMPHETAMINE-DEXTROAMPHETAMINE 20 MG PO TABS: 20.0000 mg | ORAL_TABLET | Freq: Every day | ORAL | 0 refills | Status: DC

## 2020-03-27 NOTE — Telephone Encounter (Signed)
OK, adderall 20mg  eRx'd for this month and for March.

## 2020-03-27 NOTE — Telephone Encounter (Signed)
Requesting: adderall 20mg   Contract: 09/16/2018 UDS: 05/03/19 Last Visit:02/22/20 Next Visit: recommended 6 month f/u Last Refill:02/22/20 (30,0)  Please Advise

## 2020-04-21 ENCOUNTER — Other Ambulatory Visit: Payer: Self-pay | Admitting: Family Medicine

## 2020-05-01 ENCOUNTER — Encounter: Payer: Self-pay | Admitting: Family Medicine

## 2020-05-16 ENCOUNTER — Other Ambulatory Visit: Payer: Self-pay | Admitting: Family Medicine

## 2020-05-31 ENCOUNTER — Encounter: Payer: Self-pay | Admitting: Family Medicine

## 2020-05-31 NOTE — Telephone Encounter (Signed)
Requesting: Adderall Contract: 09/13/18 UDS: 05/03/19 Last Visit: 02/22/20 Next Visit: advised to f/u July Last Refill: 03/27/20(30,0)  Please Advise. Medication pending

## 2020-06-01 MED ORDER — AMPHETAMINE-DEXTROAMPHETAMINE 20 MG PO TABS: 20.0000 mg | ORAL_TABLET | Freq: Every day | ORAL | 0 refills | Status: DC

## 2020-06-01 NOTE — Telephone Encounter (Signed)
Adderall rx's for April and May sent in. Needs f/u in June.-thx

## 2020-06-06 ENCOUNTER — Encounter: Payer: Self-pay | Admitting: Family Medicine

## 2020-06-10 IMAGING — CR CERVICAL SPINE - COMPLETE 4+ VIEW
5 series · 5 of 5 positions shown · non-contrast
Comparison: None.

CLINICAL DATA: Bilateral neck pain.  No injury.

EXAM:
CERVICAL SPINE - COMPLETE 4+ VIEW

[w c-spine lat]
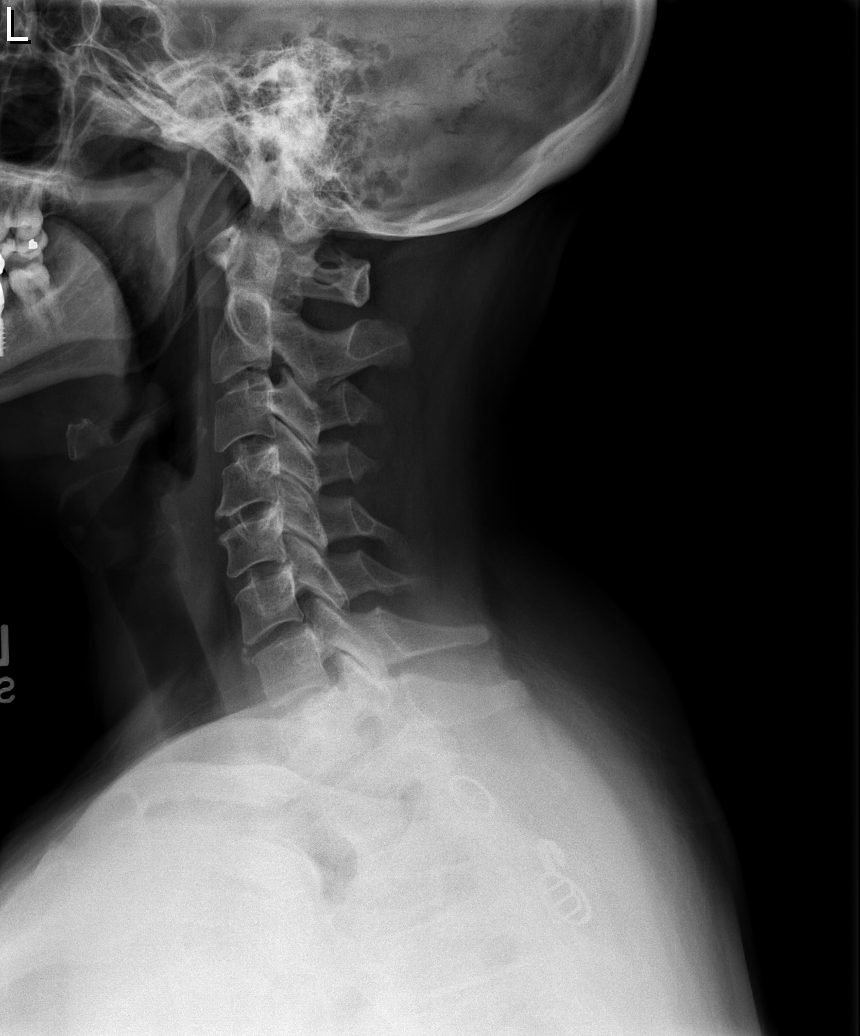

[w c-spine oblique (1 of 2)]
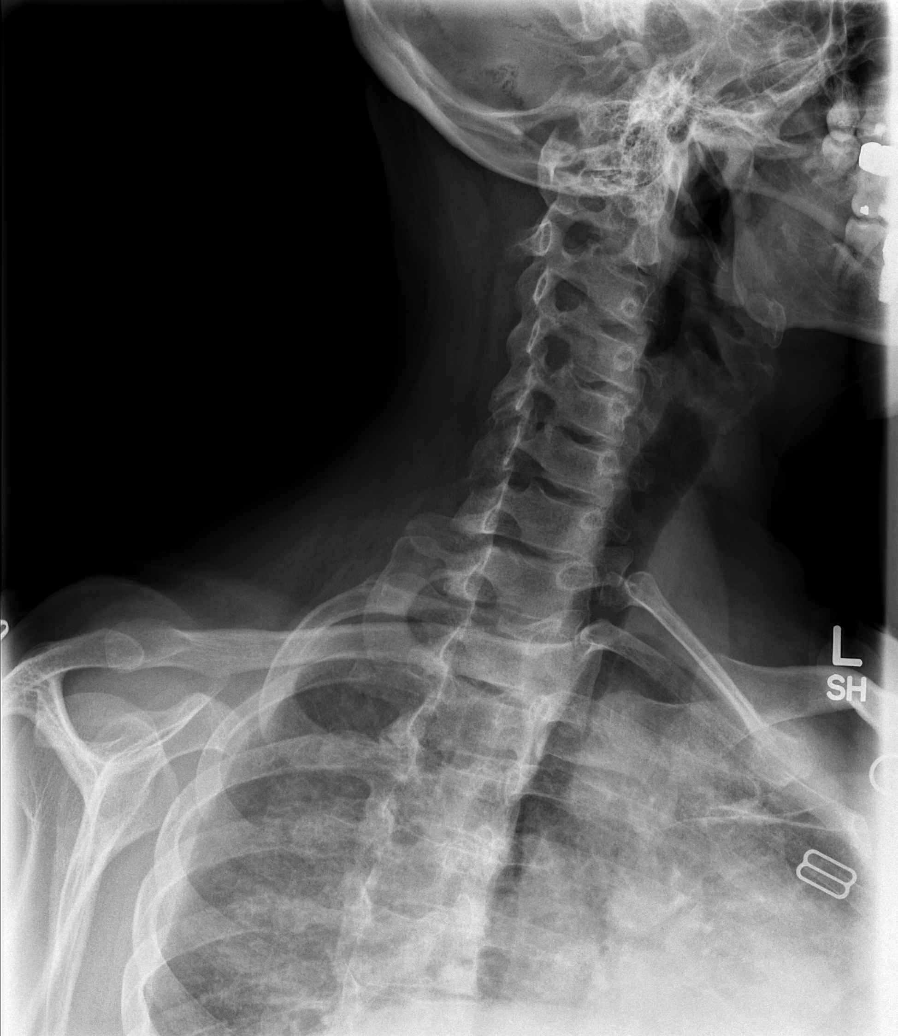

[w c-spine oblique (2 of 2)]
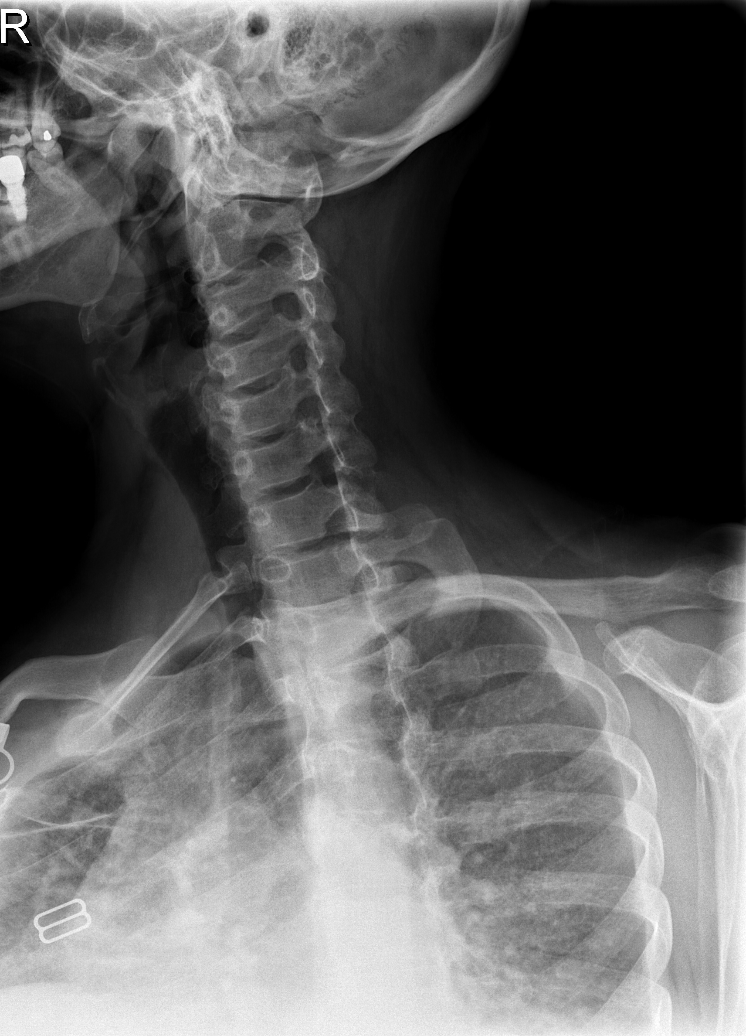

[w c-spine a.p.]
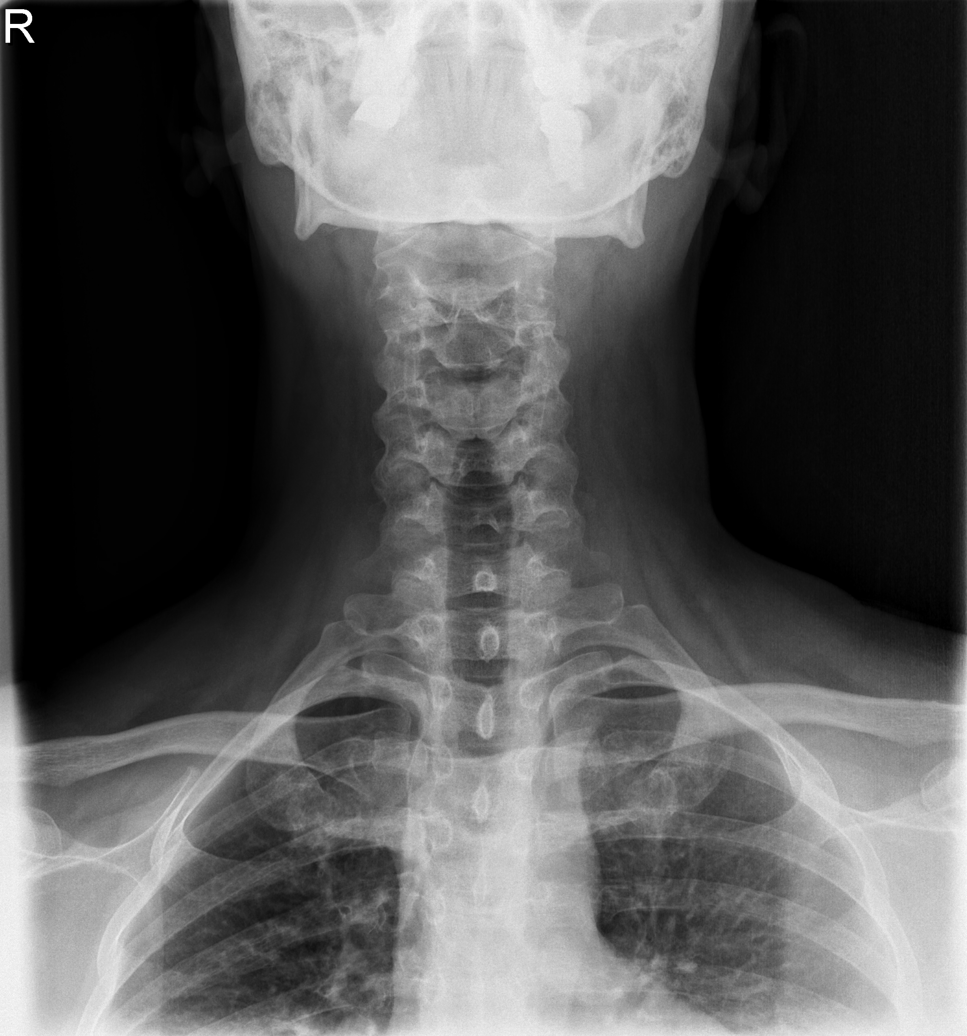

[w c-spine odontoid]
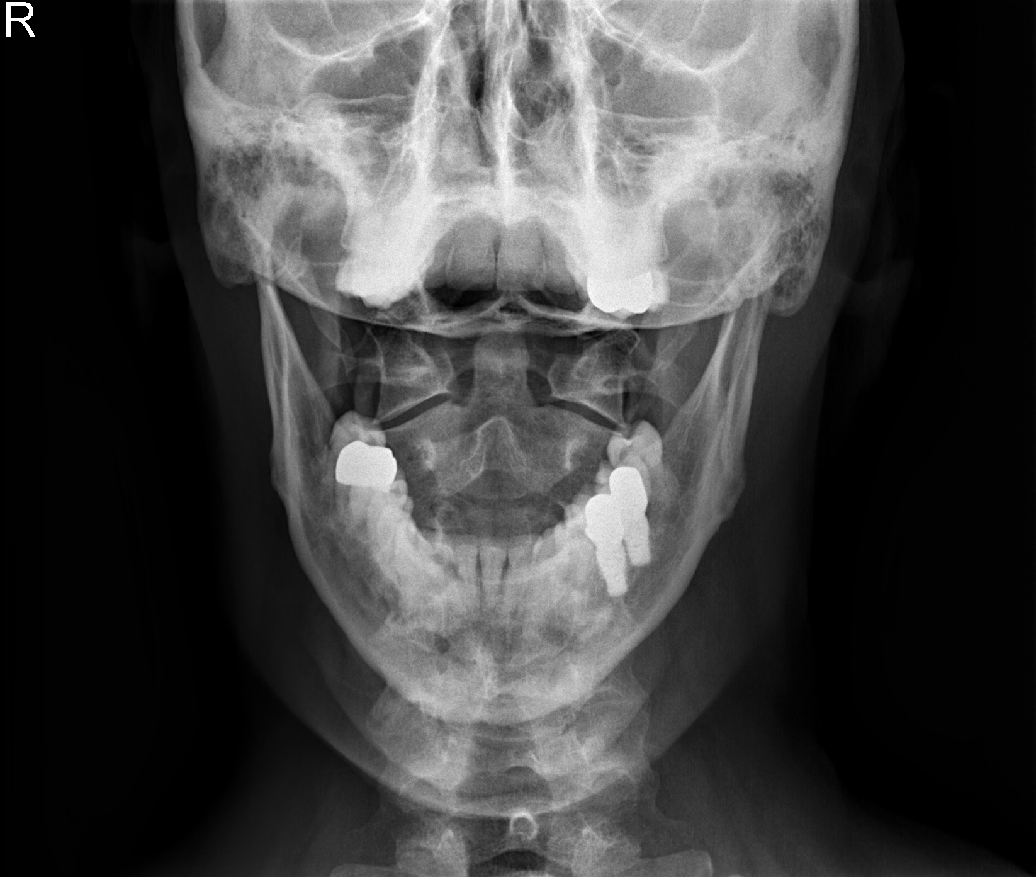

[5 of 5 positions shown; findings below may reference images not displayed]

FINDINGS: Early uncovertebral spurring and facet disease causes mild right
C5-6 neural foraminal narrowing. Normal alignment. Disc spaces
maintained. No fracture. Prevertebral soft tissues normal.
IMPRESSION: Early C5-6 neural foraminal narrowing due to uncovertebral spurring
and facet disease. No acute bony abnormality.

## 2020-06-13 ENCOUNTER — Other Ambulatory Visit: Payer: Self-pay | Admitting: Family Medicine

## 2020-07-02 ENCOUNTER — Encounter: Payer: Self-pay | Admitting: Family Medicine

## 2020-08-03 ENCOUNTER — Other Ambulatory Visit: Payer: Self-pay | Admitting: Family Medicine

## 2020-08-03 NOTE — Telephone Encounter (Signed)
Requesting: adderall 20mg  Contract: 09/13/18 UDS: 05/03/19 Last Visit: 03/02/20 Next Visit: advised to f/u 6 mo. Last Refill: 06/01/20(30,0) electronic RF for May  Please Advise. Medication pending

## 2020-08-06 MED ORDER — AMPHETAMINE-DEXTROAMPHETAMINE 20 MG PO TABS: 20.0000 mg | ORAL_TABLET | Freq: Every day | ORAL | 0 refills | Status: DC

## 2020-08-16 DIAGNOSIS — Z0100 Encounter for examination of eyes and vision without abnormal findings: Secondary | ICD-10-CM | POA: Diagnosis not present

## 2020-09-10 ENCOUNTER — Other Ambulatory Visit: Payer: Self-pay | Admitting: Family Medicine

## 2020-09-10 DIAGNOSIS — Z1211 Encounter for screening for malignant neoplasm of colon: Secondary | ICD-10-CM

## 2020-09-10 NOTE — Telephone Encounter (Signed)
Due for f/u. I will do 30d supply

## 2020-09-10 NOTE — Telephone Encounter (Signed)
RF request for Pregabalin Contract: N/A UDS: 05/03/19 LOV: 02/22/20 Next ov: advised to f/u July Last written: 02/22/20(60,5)  Please advise, thanks. Med pending

## 2020-09-11 NOTE — Telephone Encounter (Signed)
My chart message sent to schedule appt.

## 2020-09-17 MED ORDER — AMPHETAMINE-DEXTROAMPHETAMINE 20 MG PO TABS: 20.0000 mg | ORAL_TABLET | Freq: Every day | ORAL | 0 refills | Status: DC

## 2020-09-17 NOTE — Telephone Encounter (Signed)
Pt requesting refill for Adderall Contract: 09/13/18 UDS:05/03/19 Last Visit:02/22/20 Next Visit:09/26/20 Last Refill: 08/06/20(30,0)  Please Advise. Med pending

## 2020-09-19 ENCOUNTER — Other Ambulatory Visit: Payer: Self-pay | Admitting: Family Medicine

## 2020-09-20 NOTE — Telephone Encounter (Signed)
Requesting: tramadol Contract: 09/13/18 UDS: 05/03/19 Last Visit: 02/22/20 Next Visit: 09/26/20 Last Refill:02/22/20(120,1)  Please Advise. Med pending

## 2020-09-26 ENCOUNTER — Ambulatory Visit: Payer: BC Managed Care – PPO | Admitting: Family Medicine

## 2020-09-28 ENCOUNTER — Ambulatory Visit: Payer: BC Managed Care – PPO | Admitting: Family Medicine

## 2020-09-28 ENCOUNTER — Other Ambulatory Visit: Payer: Self-pay

## 2020-09-28 ENCOUNTER — Encounter: Payer: Self-pay | Admitting: Family Medicine

## 2020-09-28 VITALS — BP 119/76 | HR 96 | Temp 98.5°F | Ht 67.5 in | Wt 212.0 lb

## 2020-09-28 DIAGNOSIS — F988 Other specified behavioral and emotional disorders with onset usually occurring in childhood and adolescence: Secondary | ICD-10-CM | POA: Diagnosis not present

## 2020-09-28 DIAGNOSIS — F411 Generalized anxiety disorder: Secondary | ICD-10-CM

## 2020-09-28 DIAGNOSIS — Z79899 Other long term (current) drug therapy: Secondary | ICD-10-CM

## 2020-09-28 DIAGNOSIS — M797 Fibromyalgia: Secondary | ICD-10-CM | POA: Diagnosis not present

## 2020-09-28 DIAGNOSIS — Z1211 Encounter for screening for malignant neoplasm of colon: Secondary | ICD-10-CM

## 2020-09-28 MED ORDER — TRAMADOL HCL 50 MG PO TABS: ORAL_TABLET | ORAL | 1 refills | Status: DC

## 2020-09-28 MED ORDER — LISDEXAMFETAMINE DIMESYLATE 20 MG PO CAPS: 20.0000 mg | ORAL_CAPSULE | Freq: Every day | ORAL | 0 refills | Status: DC

## 2020-09-28 MED ORDER — PREGABALIN 150 MG PO CAPS: 150.0000 mg | ORAL_CAPSULE | Freq: Two times a day (BID) | ORAL | 5 refills | Status: DC

## 2020-09-28 MED ORDER — ZOLMITRIPTAN 5 MG PO TABS: ORAL_TABLET | ORAL | 6 refills | Status: DC

## 2020-09-28 MED ORDER — PROPRANOLOL HCL ER BEADS 80 MG PO CP24: ORAL_CAPSULE | ORAL | 5 refills | Status: DC

## 2020-09-28 NOTE — Progress Notes (Signed)
OFFICE VISIT  09/28/2020  CC:  Chief Complaint  Patient presents with   ADHD    HPI:    Patient is a 55 y.o. Caucasian female who presents for 7 mo f/u fibromyalgia, anx/dep, and adult ADD. A/P as of last visit: "1) Adult ADD: waning response after being on 15mg  IR adderall for approx 1 yr->will inc dose to 15mg  qd, #30.   2) Myofascial pain syndrome: fairly stable, does reasonably well with lyrica 150mg  bid and tramadil 50mg  qd->continue this.   3) Anx/dep: other than periods of normal life-induced flares she is overall doing well on pristiq 100 mg qd long term.   4) Health maintenance exam: Reviewed age and gender appropriate health maintenance issues (prudent diet, regular exercise, health risks of tobacco and excessive alcohol, use of seatbelts, fire alarms in home, use of sunscreen).  Also reviewed age and gender appropriate health screening as well as vaccine recommendations. Vaccines: flu->declines.  Covid 19->declines.  Otherwise UTD. Labs: fasting HP Cervical ca screening: Dr. Breast ca screening: last one 2 yrs ago, gets this through GYN. Colon ca screening: average risk patient= as per latest guidelines, start screening at any time now->cologuard."  INTERIM HX: Doing okay, good days and bad days.  Adderall xr20mg  qd helps some but not ideal, gets about 8h duration of effect, tends to have lots of appetite and eats bad when it wears off.  No insomnia probs from it.  Takes a tramadol once every night and helps w/fibro pain and bilat hip bursitis pain.  Stress and anxiety high.  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 No depressed mood but lots of irritability.  Some weeks has lots of HAs, other weeks not bad, she can't tell any consistent trigger except weather changes. She is a stress eater.  ROS as above, plus--> no fevers, no CP, no SOB, no wheezing, no cough, no dizziness, no HAs, no rashes, no melena/hematochezia.  No polyuria or polydipsia.   No focal weakness, paresthesias, or tremors.  No acute vision or hearing abnormalities.  No dysuria or unusual/new urinary urgency or frequency.  No recent changes in lower legs. No n/v/d or abd pain.  No palpitations.     Past Medical History:  Diagnosis Date   Adult ADHD 06/2017   DDD (degenerative disc disease), cervical 07/2018   VERY mild C5-6, right   GAD (generalized anxiety disorder)    with panic attacks   History of abnormal Pap smear    Repeats are always normal per pt (has GYN MD)   History of depression    Lexapro helped for a while, Pristique started 04/2011   Intercostal muscle tear 11/2013   R side   Migraines    Myofascial pain 2020   upper back/neck   Subclinical hyperthyroidism 2010   Some low TSHs and normal FT4 and T3   Vitamin D deficiency 11/2011    Past Surgical History:  Procedure Laterality Date   Unremarkable.      Outpatient Medications Prior to Visit  Medication Sig Dispense Refill   desvenlafaxine (PRISTIQ) 100 MG 24 hr tablet TAKE 1 TABLET BY MOUTH EVERY DAY 90 tablet 2   Multiple Vitamins-Calcium (ONE-A-DAY WOMENS PO) Take by mouth. Take 1 daily.     valACYclovir (VALTREX) 1000 MG tablet 2 tabs po q12h x 2 doses at onset of outbreak of cold sores 20 tablet 1   amphetamine-dextroamphetamine (ADDERALL) 20 MG tablet Take 1 tablet (20 mg total) by mouth  daily. 30 tablet 0   pregabalin (LYRICA) 150 MG capsule TAKE 1 CAPSULE BY MOUTH TWICE A DAY 60 capsule 0   traMADol (ULTRAM) 50 MG tablet TAKE 1 TO 2 TABLETS BY MOUTH TWICE A DAY AS NEEDED FOR PAIN 120 tablet 0   zolmitriptan (ZOMIG) 5 MG tablet TAKE 1 TAB AT ONSET OF MIGRAINE, MAY REPEAT DOSE IN 2 HOURS IF NEEDED. MAX IN 24H IS 10 MG. 8 tablet 1   No facility-administered medications prior to visit.    No Known Allergies  ROS As per HPI  PE: Vitals with BMI 09/28/2020 02/22/2020 05/03/2019  Height 5' 7.5" - 5\' 7"   Weight 212 lbs 223 lbs 213 lbs 10 oz  BMI 32.69 - 33.45  Systolic 119 132 94   Diastolic 76 81 67  Pulse 96 94 88     Wt Readings from Last 2 Encounters:  09/28/20 212 lb (96.2 kg)  02/22/20 223 lb (101.2 kg)    Gen: alert, oriented x 4, affect pleasant.  Lucid thinking and conversation noted. HEENT: PERRLA, EOMI.   Neck: no LAD, mass, or thyromegaly. CV: RRR, no m/r/g LUNGS: CTA bilat, nonlabored. NEURO: no tremor or tics noted on observation.  Coordination intact. CN 2-12 grossly intact bilaterally, strength 5/5 in all extremeties.  No ataxia.   LABS:  Lab Results  Component Value Date   TSH 0.77 02/22/2020   Lab Results  Component Value Date   WBC 6.0 02/22/2020   HGB 13.9 02/22/2020   HCT 40.6 02/22/2020   MCV 85.8 02/22/2020   PLT 213.0 02/22/2020   Lab Results  Component Value Date   CREATININE 0.72 02/22/2020   BUN 13 02/22/2020   NA 140 02/22/2020   K 4.4 02/22/2020   CL 105 02/22/2020   CO2 29 02/22/2020   Lab Results  Component Value Date   ALT 25 02/22/2020   AST 19 02/22/2020   ALKPHOS 86 02/22/2020   BILITOT 0.6 02/22/2020   Lab Results  Component Value Date   CHOL 209 (H) 02/22/2020   Lab Results  Component Value Date   HDL 43.60 02/22/2020   Lab Results  Component Value Date   LDLCALC 137 (H) 02/22/2020   Lab Results  Component Value Date   TRIG 140.0 02/22/2020   Lab Results  Component Value Date   CHOLHDL 5 02/22/2020    IMPRESSION AND PLAN:  1) Migraine HAs: good response to zomig every time but is having too many. Start innopran xl 80mg  q evening. Therapeutic expectations and side effect profile of medication discussed today.  Patient's questions answered.  2) Fibromyalgia: gets good effect from lyrica 150mg  bid and tramadol 1-2 qd prn. New rx's for these today. CSC updated. UDS today.  3) Adult ADHD: suboptimal effect and inadequate duration of action on adderall xr. Will change to vyvanse 20mg  qd.  Therapeutic expectations and side effect profile of medication discussed today.  Patient's  questions answered. UDS today.  4) GAD: pristiq 100mg  qd has helped well.  An After Visit Summary was printed and given to the patient.  FOLLOW UP: Return in about 6 months (around 03/31/2021) for annual CPE (fasting).  Signed:  , MD           09/28/2020

## 2020-09-30 LAB — DRUG MONITORING PANEL 376104, URINE
Amphetamine: 1278 ng/mL — ABNORMAL HIGH (ref <250)
Amphetamines: POSITIVE ng/mL — AB (ref <500)
Barbiturates: NEGATIVE ng/mL (ref <300)
Benzodiazepines: NEGATIVE ng/mL (ref <100)
Cocaine Metabolite: NEGATIVE ng/mL (ref <150)
Desmethyltramadol: 1054 ng/mL — ABNORMAL HIGH (ref <100)
Methamphetamine: NEGATIVE ng/mL (ref <250)
Opiates: NEGATIVE ng/mL (ref <100)
Oxycodone: NEGATIVE ng/mL (ref <100)
Tramadol: 455 ng/mL — ABNORMAL HIGH (ref <100)

## 2020-09-30 LAB — DM TEMPLATE

## 2020-10-15 ENCOUNTER — Other Ambulatory Visit: Payer: Self-pay | Admitting: Family Medicine

## 2020-10-15 DIAGNOSIS — Z1211 Encounter for screening for malignant neoplasm of colon: Secondary | ICD-10-CM

## 2020-10-25 ENCOUNTER — Encounter: Payer: Self-pay | Admitting: Family Medicine

## 2020-10-25 MED ORDER — LISDEXAMFETAMINE DIMESYLATE 40 MG PO CAPS: ORAL_CAPSULE | ORAL | 0 refills | Status: DC

## 2020-10-25 NOTE — Telephone Encounter (Signed)
Vyvanse 40mg  eRx'd

## 2020-11-23 ENCOUNTER — Encounter: Payer: Self-pay | Admitting: Family Medicine

## 2020-11-23 MED ORDER — AMPHETAMINE-DEXTROAMPHETAMINE 20 MG PO TABS: 20.0000 mg | ORAL_TABLET | Freq: Two times a day (BID) | ORAL | 0 refills | Status: DC

## 2020-11-23 NOTE — Telephone Encounter (Signed)
Adderall eRx'd 

## 2020-12-24 ENCOUNTER — Encounter: Payer: Self-pay | Admitting: Family Medicine

## 2020-12-24 NOTE — Telephone Encounter (Signed)
Requesting: tramadol Contract: 09/28/20 UDS: 09/28/20 Last Visit: 09/28/20 Next Visit: advised to f/u Feb Last Refill: 09/28/20(120,1)  Please Advise. Medication pending

## 2020-12-24 NOTE — Telephone Encounter (Signed)
I put RF x 1 on the rx I did for her in August.  Has she requested this RF from pharmacy yet?

## 2021-01-10 DIAGNOSIS — Z01419 Encounter for gynecological examination (general) (routine) without abnormal findings: Secondary | ICD-10-CM | POA: Diagnosis not present

## 2021-01-10 DIAGNOSIS — Z6833 Body mass index (BMI) 33.0-33.9, adult: Secondary | ICD-10-CM | POA: Diagnosis not present

## 2021-01-31 ENCOUNTER — Encounter: Payer: Self-pay | Admitting: Family Medicine

## 2021-01-31 MED ORDER — AMPHETAMINE-DEXTROAMPHETAMINE 20 MG PO TABS: 20.0000 mg | ORAL_TABLET | Freq: Two times a day (BID) | ORAL | 0 refills | Status: DC

## 2021-01-31 NOTE — Telephone Encounter (Signed)
Requesting: adderall Contract: 09/28/20 UDS: 09/28/20 Last Visit: 09/28/20 Next Visit: 6 month f/u due Feb Last Refill: 11/23/20(60,0)  Please Advise. Med pending

## 2021-02-14 ENCOUNTER — Other Ambulatory Visit: Payer: Self-pay | Admitting: Family Medicine

## 2021-02-20 DIAGNOSIS — Z1231 Encounter for screening mammogram for malignant neoplasm of breast: Secondary | ICD-10-CM | POA: Diagnosis not present

## 2021-03-12 ENCOUNTER — Other Ambulatory Visit: Payer: Self-pay | Admitting: Family Medicine

## 2021-03-16 ENCOUNTER — Other Ambulatory Visit: Payer: Self-pay | Admitting: Family Medicine

## 2021-03-28 ENCOUNTER — Other Ambulatory Visit: Payer: Self-pay | Admitting: Family Medicine

## 2021-03-29 ENCOUNTER — Other Ambulatory Visit: Payer: Self-pay | Admitting: Family Medicine

## 2021-03-29 ENCOUNTER — Other Ambulatory Visit: Payer: Self-pay

## 2021-03-29 MED ORDER — PREGABALIN 150 MG PO CAPS: 150.0000 mg | ORAL_CAPSULE | Freq: Two times a day (BID) | ORAL | 1 refills | Status: DC

## 2021-03-29 NOTE — Telephone Encounter (Signed)
RF request for Pregabalin LOV: 09/25/20 Next ov: 05/01/21 Last written: 09/28/20(60,5)

## 2021-04-01 ENCOUNTER — Encounter: Payer: Self-pay | Admitting: Family Medicine

## 2021-04-01 MED ORDER — AMPHETAMINE-DEXTROAMPHETAMINE 20 MG PO TABS: 20.0000 mg | ORAL_TABLET | Freq: Two times a day (BID) | ORAL | 0 refills | Status: DC

## 2021-04-01 NOTE — Telephone Encounter (Signed)
Requesting: adderall Contract: 09/28/20 UDS: 09/28/20 Last Visit: 09/28/20 Next Visit: 05/01/21 Last Refill: 01/31/21(60,0)  Please Advise. Med pending

## 2021-04-23 ENCOUNTER — Other Ambulatory Visit: Payer: Self-pay | Admitting: Family Medicine

## 2021-04-27 ENCOUNTER — Other Ambulatory Visit: Payer: Self-pay | Admitting: Family Medicine

## 2021-04-30 ENCOUNTER — Other Ambulatory Visit: Payer: Self-pay

## 2021-04-30 DIAGNOSIS — F909 Attention-deficit hyperactivity disorder, unspecified type: Secondary | ICD-10-CM | POA: Insufficient documentation

## 2021-04-30 DIAGNOSIS — M797 Fibromyalgia: Secondary | ICD-10-CM | POA: Insufficient documentation

## 2021-05-01 ENCOUNTER — Ambulatory Visit (INDEPENDENT_AMBULATORY_CARE_PROVIDER_SITE_OTHER): Payer: BC Managed Care – PPO | Admitting: Family Medicine

## 2021-05-01 ENCOUNTER — Encounter: Payer: Self-pay | Admitting: Family Medicine

## 2021-05-01 VITALS — BP 107/74 | HR 95 | Temp 98.1°F | Ht 67.0 in | Wt 214.0 lb

## 2021-05-01 DIAGNOSIS — F988 Other specified behavioral and emotional disorders with onset usually occurring in childhood and adolescence: Secondary | ICD-10-CM | POA: Diagnosis not present

## 2021-05-01 DIAGNOSIS — Z0000 Encounter for general adult medical examination without abnormal findings: Secondary | ICD-10-CM | POA: Diagnosis not present

## 2021-05-01 DIAGNOSIS — E7800 Pure hypercholesterolemia, unspecified: Secondary | ICD-10-CM

## 2021-05-01 DIAGNOSIS — E0590 Thyrotoxicosis, unspecified without thyrotoxic crisis or storm: Secondary | ICD-10-CM | POA: Diagnosis not present

## 2021-05-01 DIAGNOSIS — M7918 Myalgia, other site: Secondary | ICD-10-CM | POA: Diagnosis not present

## 2021-05-01 DIAGNOSIS — Z79899 Other long term (current) drug therapy: Secondary | ICD-10-CM

## 2021-05-01 LAB — COMPREHENSIVE METABOLIC PANEL
ALT: 15 U/L (ref 0–35)
AST: 16 U/L (ref 0–37)
Albumin: 4.3 g/dL (ref 3.5–5.2)
Alkaline Phosphatase: 75 U/L (ref 39–117)
BUN: 14 mg/dL (ref 6–23)
CO2: 29 mEq/L (ref 19–32)
Calcium: 9.1 mg/dL (ref 8.4–10.5)
Chloride: 105 mEq/L (ref 96–112)
Creatinine, Ser: 0.77 mg/dL (ref 0.40–1.20)
GFR: 88.09 mL/min (ref >60.00)
Glucose, Bld: 89 mg/dL (ref 70–99)
Potassium: 4.3 mEq/L (ref 3.5–5.1)
Sodium: 140 mEq/L (ref 135–145)
Total Bilirubin: 0.8 mg/dL (ref 0.2–1.2)
Total Protein: 6.5 g/dL (ref 6.0–8.3)

## 2021-05-01 LAB — CBC
HCT: 39.8 % (ref 36.0–46.0)
Hemoglobin: 13.4 g/dL (ref 12.0–15.0)
MCHC: 33.6 g/dL (ref 30.0–36.0)
MCV: 89.8 fl (ref 78.0–100.0)
Platelets: 222 10*3/uL (ref 150.0–400.0)
RBC: 4.43 Mil/uL (ref 3.87–5.11)
RDW: 13 % (ref 11.5–15.5)
WBC: 5.4 10*3/uL (ref 4.0–10.5)

## 2021-05-01 LAB — LIPID PANEL
Cholesterol: 226 mg/dL — ABNORMAL HIGH (ref 0–200)
HDL: 41.9 mg/dL (ref >39.00)
LDL Cholesterol: 157 mg/dL — ABNORMAL HIGH (ref 0–99)
NonHDL: 183.79
Total CHOL/HDL Ratio: 5
Triglycerides: 132 mg/dL (ref 0.0–149.0)
VLDL: 26.4 mg/dL (ref 0.0–40.0)

## 2021-05-01 LAB — TSH: TSH: 0.91 u[IU]/mL (ref 0.35–5.50)

## 2021-05-01 MED ORDER — DESVENLAFAXINE SUCCINATE ER 50 MG PO TB24
ORAL_TABLET | ORAL | 0 refills | Status: DC
Start: 2021-05-01 — End: 2021-05-30

## 2021-05-01 MED ORDER — BUPROPION HCL ER (XL) 150 MG PO TB24: ORAL_TABLET | ORAL | 1 refills | Status: DC

## 2021-05-01 MED ORDER — PREGABALIN 150 MG PO CAPS: 150.0000 mg | ORAL_CAPSULE | Freq: Two times a day (BID) | ORAL | 5 refills | Status: DC

## 2021-05-01 MED ORDER — TRAMADOL HCL 50 MG PO TABS: ORAL_TABLET | ORAL | 1 refills | Status: DC

## 2021-05-01 MED ORDER — AMPHETAMINE-DEXTROAMPHETAMINE 20 MG PO TABS: 20.0000 mg | ORAL_TABLET | Freq: Two times a day (BID) | ORAL | 0 refills | Status: DC

## 2021-05-01 MED ORDER — ZOLMITRIPTAN 5 MG PO TABS: ORAL_TABLET | ORAL | 6 refills | Status: DC

## 2021-05-01 NOTE — Progress Notes (Signed)
Office Note ?05/01/2021 ? ?CC:  ?Chief Complaint  ?Patient presents with  ? Annual Exam  ?  Pt is fasting  ? ? ?HPI:  ?Patient is a 56 y.o. female who is here for annual health maintenance exam and f/u adult ADD,hx of anx/dep, and myofascial neck/back pain syndrome. ? ?Anxiety and depression worse the last 6 months. ?Anhedonia, hopelessness, poor concentration, worry constantly.  No SI or HI.  Sleep is disrupted.  Chronically ill father and other outside stressors significantly contributing. ?Wants to try something other than Pristiq because she feels like this has not helped much. ?She prefers something that has low risk of gaining weight. ? ?ADD: Pt states all is going well with the med at current dosing: much improved focus, concentration, task completion.  Less frustration, better multitasking, less impulsivity and restlessness.  Mood is stable. ?No side effects from the medication. ? ?Pain in both lateral hip areas persists, worse at night, often feels like she needs to take more than 1 tramadol, but she consistently just takes 1. ?Asks if she can take 2 if necessary.  She does not take this medicine in the daytime. ?Chronic myofascial neck and back pain still problematic. ? ?Past Medical History:  ?Diagnosis Date  ? Adult ADHD 06/2017  ? DDD (degenerative disc disease), cervical 07/2018  ? VERY mild C5-6, right  ? GAD (generalized anxiety disorder)   ? with panic attacks  ? History of abnormal Pap smear   ? Repeats are always normal per pt (has GYN MD)  ? History of depression   ? Lexapro helped for a while, Pristique started 04/2011  ? Intercostal muscle tear 11/2013  ? R side  ? Migraines   ? Myofascial pain 2020  ? upper back/neck  ? Subclinical hyperthyroidism 2010  ? Some low TSHs and normal FT4 and T3  ? Vitamin D deficiency 11/2011  ? ? ?Past Surgical History:  ?Procedure Laterality Date  ? Unremarkable.    ? ? ?Family History  ?Problem Relation Age of Onset  ? Cancer Maternal Aunt   ? Healthy Mother    ? Other Father   ?     Unknown Health status.  ? Hypertension Sister   ? ? ?Social History  ? ?Socioeconomic History  ? Marital status: Married  ?  Spouse name: Not on file  ? Number of children: Not on file  ? Years of education: Not on file  ? Highest education level: Not on file  ?Occupational History  ? Not on file  ?Tobacco Use  ? Smoking status: Former  ? Smokeless tobacco: Never  ? Tobacco comments:  ?  Quit approx [5] years ago.  ?Substance and Sexual Activity  ? Alcohol use: Yes  ? Drug use: No  ? Sexual activity: Yes  ?Other Topics Concern  ? Not on file  ?Social History Narrative  ? Married, 3 step children.  ? 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    ? Originally from [place].  ? Former cig smoker, quit 2009.  Occ alcohol.  No drugs.  ? Works out with [employer] and walks several days a week.  ?   ? ?Social Determinants of Health  ? ?Financial Resource Strain: Not on file  ?Food Insecurity: Not on file  ?Transportation Needs: Not on file  ?Physical Activity: Not on file  ?Stress: Not on file  ?Social Connections: Not on file  ?Intimate Partner Violence: Not on file  ? ? ?Outpatient Medications Prior  to Visit  ?Medication Sig Dispense Refill  ? amphetamine-dextroamphetamine (ADDERALL) 20 MG tablet Take 1 tablet (20 mg total) by mouth 2 (two) times daily. 60 tablet 0  ? desvenlafaxine (PRISTIQ) 100 MG 24 hr tablet TAKE 1 TABLET (100 MG TOTAL) BY MOUTH DAILY. DUE FOR APPT IN FEB FOR REFILLLS 30 tablet 0  ? Multiple Vitamins-Calcium (ONE-A-DAY WOMENS PO) Take by mouth. Take 1 daily.    ? pregabalin (LYRICA) 150 MG capsule Take 1 capsule (150 mg total) by mouth 2 (two) times daily. 60 capsule 1  ? traMADol (ULTRAM) 50 MG tablet TAKE 1 TO 2 TABLETS BY MOUTH TWICE A DAY AS NEEDED FOR PAIN 120 tablet 1  ? zolmitriptan (ZOMIG) 5 MG tablet TAKE 1 TAB AT ONSET OF MIGRAINE, MAY REPEAT DOSE IN 2 HOURS IF NEEDED. MAX IN 24H IS 10 MG. 8 tablet 6  ? valACYclovir (VALTREX) 1000 MG tablet 2 tabs po  q12h x 2 doses at onset of outbreak of cold sores (Patient not taking: Reported on 05/01/2021) 20 tablet 1  ? propranolol (INNOPRAN XL) 80 MG 24 hr capsule 1 cap po q evening (Patient not taking: Reported on 05/01/2021) 30 capsule 5  ? ?No facility-administered medications prior to visit.  ? ? ?No Known Allergies ? ?ROS ?Review of Systems  ?Constitutional:  Negative for appetite change, chills, fatigue and fever.  ?HENT:  Negative for congestion, dental problem, ear pain and sore throat.   ?Eyes:  Negative for discharge, redness and visual disturbance.  ?Respiratory:  Negative for cough, chest tightness, shortness of breath and wheezing.   ?Cardiovascular:  Negative for chest pain, palpitations and leg swelling.  ?Gastrointestinal:  Negative for abdominal pain, blood in stool, diarrhea, nausea and vomiting.  ?Genitourinary:  Negative for difficulty urinating, dysuria, flank pain, frequency, hematuria and urgency.  ?Musculoskeletal:  Positive for back pain and myalgias. Negative for arthralgias, joint swelling and neck stiffness.  ?Skin:  Negative for pallor and rash.  ?Neurological:  Negative for dizziness, speech difficulty, weakness and headaches.  ?Hematological:  Negative for adenopathy. Does not bruise/bleed easily.  ?Psychiatric/Behavioral:  Negative for confusion and sleep disturbance. The patient is not nervous/anxious.   ? ?PE; ?Vitals with BMI 05/01/2021 09/28/2020 02/22/2020  ?Height 5\' 7"  5' 7.5" -  ?Weight 214 lbs 212 lbs 223 lbs  ?BMI 33.51 32.69 -  ?Systolic 107 119  ?Diastolic 74 76 81  ?Pulse 95 96 94  ? ?Exam chaperoned by 389, CMA. ?Gen: Alert, well appearing.  Patient is oriented to person, place, time, and situation. ?AFFECT: pleasant, lucid thought and speech. ?ENT: Ears: EACs clear, normal epithelium.  TMs with good light reflex and landmarks bilaterally.  Eyes: no injection, icteris, swelling, or exudate.  EOMI, PERRLA. ?Nose: no drainage or turbinate edema/swelling.  No injection  or focal lesion.  Mouth: lips without lesion/swelling.  Oral mucosa pink and moist.  Dentition intact and without obvious caries or gingival swelling.  Oropharynx without erythema, exudate, or swelling.  ?Neck: supple/nontender.  No LAD, mass, or TM.  Carotid pulses 2+ bilaterally, without bruits. ?CV: RRR, no m/r/g.   ?LUNGS: CTA bilat, nonlabored resps, good aeration in all lung fields. ?ABD: soft, NT, ND, BS normal.  No hepatospenomegaly or mass.  No bruits. ?EXT: no clubbing, cyanosis, or edema.  ?Musculoskeletal: no joint swelling, erythema, warmth, or tenderness.  ROM of all joints intact. ?Skin - no sores or suspicious lesions or rashes or color changes ? ?Pertinent labs:  ?Lab Results  ?Component Value  Date  ? TSH 0.77 02/22/2020  ? ?Lab Results  ?Component Value Date  ? WBC 6.0 02/22/2020  ? HGB 13.9 02/22/2020  ? HCT 40.6 02/22/2020  ? MCV 85.8 02/22/2020  ? PLT 213.0 02/22/2020  ? ?Lab Results  ?Component Value Date  ? CREATININE 0.72 02/22/2020  ? BUN 13 02/22/2020  ? NA 140 02/22/2020  ? K 4.4 02/22/2020  ? CL 105 02/22/2020  ? CO2 29 02/22/2020  ? ?Lab Results  ?Component Value Date  ? ALT 25 02/22/2020  ? AST 19 02/22/2020  ? ALKPHOS 86 02/22/2020  ? BILITOT 0.6 02/22/2020  ? ?Lab Results  ?Component Value Date  ? CHOL 209 (H) 02/22/2020  ? ?Lab Results  ?Component Value Date  ? HDL 43.60 02/22/2020  ? ?Lab Results  ?Component Value Date  ? LDLCALC 137 (H) 02/22/2020  ? ?Lab Results  ?Component Value Date  ? TRIG 140.0 02/22/2020  ? ?Lab Results  ?Component Value Date  ? CHOLHDL 5 02/22/2020  ? ? ?ASSESSMENT AND PLAN:  ? ?#1 major depressive disorder, GAD. ?Not well controlled on Pristiq. ?We we will switch her over to Wellbutrin.  This choice of antidepressant was mainly based on low risk of gaining weight.  Cross titration--start Wellbutrin 150/day now and decreased Pristiq to 50 mg a day x15 days then 50 mg every other day x10 doses. ? ?#2 chronic pain syndrome--myofascial pain/fibromyalgia,  bilateral gluteal tendinopathy. ?Hip exercises discussed. ?Okay to increase tramadol to 2 tabs in the evenings as needed.  Continue Lyrica 150 twice daily. ? ?#3 adult ADD. ?She gets appropriate therapeutic

## 2021-05-24 ENCOUNTER — Other Ambulatory Visit: Payer: Self-pay | Admitting: Family Medicine

## 2021-05-30 ENCOUNTER — Encounter: Payer: Self-pay | Admitting: Family Medicine

## 2021-05-30 ENCOUNTER — Ambulatory Visit: Payer: BC Managed Care – PPO | Admitting: Family Medicine

## 2021-05-30 VITALS — BP 110/67 | HR 105 | Temp 98.3°F | Ht 67.0 in | Wt 216.0 lb

## 2021-05-30 DIAGNOSIS — F411 Generalized anxiety disorder: Secondary | ICD-10-CM | POA: Diagnosis not present

## 2021-05-30 DIAGNOSIS — F3341 Major depressive disorder, recurrent, in partial remission: Secondary | ICD-10-CM | POA: Diagnosis not present

## 2021-05-30 MED ORDER — BUPROPION HCL ER (XL) 300 MG PO TB24: 300.0000 mg | ORAL_TABLET | Freq: Every day | ORAL | 2 refills | Status: DC

## 2021-05-30 NOTE — Progress Notes (Signed)
OFFICE VISIT ? ?05/30/2021 ? ?CC:  ?Chief Complaint  ?Patient presents with  ? Depression  ? ?HPI:   ? ?Patient is a 56 y.o. female who presents for 1 month follow-up depression and anxiety, recent med changes ?A/P as of last visit: ?"#1 major depressive disorder, GAD. ?Not well controlled on Pristiq. ?We we will switch her over to Wellbutrin.  This choice of antidepressant was mainly based on low risk of gaining weight.  Cross titration--start Wellbutrin 150/day now and decreased Pristiq to 50 mg a day x15 days then 50 mg every other day x10 doses. ?  ?#2 chronic pain syndrome--myofascial pain/fibromyalgia, bilateral gluteal tendinopathy. ?Hip exercises discussed. ?Okay to increase tramadol to 2 tabs in the evenings as needed.  Continue Lyrica 150 twice daily. ?  ?#3 adult ADD. ?She gets appropriate therapeutic effect from Adderall 20 mg twice daily. ?Prescription given today for #60. ?  ?#4 Health maintenance exam: ?Reviewed age and gender appropriate health maintenance issues (prudent diet, regular exercise, health risks of tobacco and excessive alcohol, use of seatbelts, fire alarms in home, use of sunscreen).  Also reviewed age and gender appropriate health screening as well as vaccine recommendations. ?Vaccines: all UTD. ?Labs: HP ordered ?Cervical ca screening: per GYN ?Breast ca screening: per GYN ?Colon ca screening: 02/2020 cologuard neg, rpt 02/2023." ? ?INTERIM HX: ?She is having no problem with the taper off of Pristiq. ?She does not note any change in her anxiety level or level of depression. ?She says symptoms are mild to moderate at worst. ?She says that she is a type of personality likes to be in control of things but circumstances in her life lately have been beyond her control--such as one of her dogs sick with cancer and another with musculoskeletal problems, her husband having multiple chronic medical issues, she is running a busy business, etc. ?No SI or HI. ?No panic. ? ?Past Medical History:   ?Diagnosis Date  ? Adult ADHD 06/2017  ? DDD (degenerative disc disease), cervical 07/2018  ? VERY mild C5-6, right  ? GAD (generalized anxiety disorder)   ? with panic attacks  ? History of abnormal Pap smear   ? Repeats are always normal per pt (has GYN MD)  ? History of depression   ? Lexapro helped for a while, Pristique started 04/2011  ? Intercostal muscle tear 11/2013  ? R side  ? Migraines   ? Myofascial pain 2020  ? upper back/neck  ? Subclinical hyperthyroidism 2010  ? Some low TSHs and normal FT4 and T3  ? Vitamin D deficiency 11/2011  ? ? ?Past Surgical History:  ?Procedure Laterality Date  ? Unremarkable.    ? ? ?Outpatient Medications Prior to Visit  ?Medication Sig Dispense Refill  ? amphetamine-dextroamphetamine (ADDERALL) 20 MG tablet Take 1 tablet (20 mg total) by mouth 2 (two) times daily. 60 tablet 0  ? Multiple Vitamins-Calcium (ONE-A-DAY WOMENS PO) Take by mouth. Take 1 daily.    ? pregabalin (LYRICA) 150 MG capsule Take 1 capsule (150 mg total) by mouth 2 (two) times daily. 60 capsule 5  ? traMADol (ULTRAM) 50 MG tablet TAKE 1 TO 2 TABLETS BY MOUTH TWICE A DAY AS NEEDED FOR PAIN 180 tablet 1  ? zolmitriptan (ZOMIG) 5 MG tablet TAKE 1 TAB AT ONSET OF MIGRAINE, MAY REPEAT DOSE IN 2 HOURS IF NEEDED. MAX IN 24H IS 10 MG. 8 tablet 6  ? buPROPion (WELLBUTRIN XL) 150 MG 24 hr tablet 1 tab po qd 30 tablet 1  ?  valACYclovir (VALTREX) 1000 MG tablet 2 tabs po q12h x 2 doses at onset of outbreak of cold sores (Patient not taking: Reported on 05/30/2021) 20 tablet 1  ? desvenlafaxine (PRISTIQ) 50 MG 24 hr tablet 1 tab po qd x 15d, then 1 tab po qod x 10 doses, then stop (Patient not taking: Reported on 05/30/2021) 25 tablet 0  ? ?No facility-administered medications prior to visit.  ? ? ?No Known Allergies ? ?ROS ?As per HPI ? ?PE: ? ?  05/30/2021  ?  3:42 PM 05/01/2021  ?  8:00 AM 09/28/2020  ?  3:04 PM  ?Vitals with BMI  ?Height 5\' 7"  5\' 7"  5' 7.5"  ?Weight 216 lbs 214 lbs 212 lbs  ?BMI 33.82 33.51 32.69   ?Systolic 110 107  ?Diastolic 67 74 76  ?Pulse 105 95 96  ? ?Physical Exam ? ?Gen: Alert, well appearing.  Patient is oriented to person, place, time, and situation. ?AFFECT: pleasant, lucid thought and speech. ?No further exam today. ? ?LABS:  ?Last CBC ?Lab Results  ?Component Value Date  ? WBC 5.4 05/01/2021  ? HGB 13.4 05/01/2021  ? HCT 39.8 05/01/2021  ? MCV 89.8 05/01/2021  ? MCH 29.2 07/16/2018  ? RDW 13.0 05/01/2021  ? PLT 222.0 05/01/2021  ? ?Last metabolic panel ?Lab Results  ?Component Value Date  ? GLUCOSE 89 05/01/2021  ? NA 140 05/01/2021  ? K 4.3 05/01/2021  ? CL 105 05/01/2021  ? CO2 29 05/01/2021  ? BUN 14 05/01/2021  ? CREATININE 0.77 05/01/2021  ? CALCIUM 9.1 05/01/2021  ? PROT 6.5 05/01/2021  ? ALBUMIN 4.3 05/01/2021  ? BILITOT 0.8 05/01/2021  ? ALKPHOS 75 05/01/2021  ? AST 16 05/01/2021  ? ALT 15 05/01/2021  ? ?Last lipids ?Lab Results  ?Component Value Date  ? CHOL 226 (H) 05/01/2021  ? HDL 41.90 05/01/2021  ? LDLCALC 157 (H) 05/01/2021  ? LDLDIRECT 161.3 01/25/2013  ? TRIG 132.0 05/01/2021  ? CHOLHDL 5 05/01/2021  ? ?Last hemoglobin A1c ?No results found for: HGBA1C ?Last thyroid functions ?Lab Results  ?Component Value Date  ? TSH 0.91 05/01/2021  ? T3TOTAL 101 05/13/2017  ? ?  ?Lab Results  ?Component Value Date  ? ESRSEDRATE 2 07/16/2018  ? ?IMPRESSION AND PLAN: ? ?Recurrent major depressive disorder, mild. ?GAD. ?Working on trying to get improvement in her response to medication--recent switch over from 07/13/2017 to Wellbutrin not resulting in any change for better or worse that this time. ?Will increase Wellbutrin to 300 mg a day and recheck in 2 months ? ?An After Visit Summary was printed and given to the patient. ? ?FOLLOW UP: Return in about 2 months (around 07/30/2021) for f/u anx/mood/add. ? ?Signed:  [employer], MD           05/30/2021 ? ? ? ? ?

## 2021-06-02 ENCOUNTER — Other Ambulatory Visit: Payer: Self-pay | Admitting: Family Medicine

## 2021-06-25 ENCOUNTER — Encounter: Payer: Self-pay | Admitting: Family Medicine

## 2021-06-25 ENCOUNTER — Other Ambulatory Visit: Payer: Self-pay | Admitting: Family Medicine

## 2021-06-26 MED ORDER — DESVENLAFAXINE SUCCINATE ER 50 MG PO TB24: 50.0000 mg | ORAL_TABLET | Freq: Every day | ORAL | 1 refills | Status: DC

## 2021-06-26 NOTE — Telephone Encounter (Signed)
Okay to restart Pristiq but we will do at the 50 mg daily dose. ?

## 2021-06-26 NOTE — Telephone Encounter (Signed)
Patient called back to check status of her mychart message from yesterday. She states that she will be leaving to go out of town today and expects to hear from someone and to pick up prescription today.  I told patient Dr. Milinda Cave has been out of office, and that he is in patient care today. I cannot guarantee request will be approved before end of day.  She said, "okay well, if I dont hear from anyone, I will just call back this afternoon." ? ?Please follow up with patient. [phone] or mychart message. ?

## 2021-07-01 ENCOUNTER — Encounter: Payer: Self-pay | Admitting: Family Medicine

## 2021-07-01 MED ORDER — AMPHETAMINE-DEXTROAMPHETAMINE 20 MG PO TABS: 20.0000 mg | ORAL_TABLET | Freq: Two times a day (BID) | ORAL | 0 refills | Status: DC

## 2021-07-01 MED ORDER — DESVENLAFAXINE SUCCINATE ER 100 MG PO TB24: 100.0000 mg | ORAL_TABLET | Freq: Every day | ORAL | 1 refills | Status: DC

## 2021-07-01 NOTE — Telephone Encounter (Signed)
Okay, Pristiq 100 mg, 90-day supply sent. Adderall refilled

## 2021-07-01 NOTE — Telephone Encounter (Signed)
Requesting: adderall  Contract: 09/28/20 UDS:09/28/20 Last Visit: 05/30/21 Next Visit: advised to f/u 2 mo. Last Refill: 3/22/233(60,0)   RF request for pristiq LOV: 05/30/21 Next ov: advised to f/u 2 mo Last written: 50mg  06/26/21(30,1), prior to that 05/01/21 (25,0) and 100mg  dose 03/29/21(30,0)   Please review and advise.

## 2021-08-20 ENCOUNTER — Encounter: Payer: Self-pay | Admitting: Family Medicine

## 2021-08-26 ENCOUNTER — Ambulatory Visit: Payer: BC Managed Care – PPO | Admitting: Family Medicine

## 2021-08-27 ENCOUNTER — Ambulatory Visit: Payer: BC Managed Care – PPO | Admitting: Family Medicine

## 2021-08-27 ENCOUNTER — Encounter: Payer: Self-pay | Admitting: Family Medicine

## 2021-08-27 VITALS — BP 134/84 | HR 96 | Temp 97.9°F | Ht 67.13 in | Wt 208.8 lb

## 2021-08-27 DIAGNOSIS — E7800 Pure hypercholesterolemia, unspecified: Secondary | ICD-10-CM | POA: Diagnosis not present

## 2021-08-27 DIAGNOSIS — F411 Generalized anxiety disorder: Secondary | ICD-10-CM

## 2021-08-27 DIAGNOSIS — E669 Obesity, unspecified: Secondary | ICD-10-CM | POA: Diagnosis not present

## 2021-08-27 DIAGNOSIS — Z79899 Other long term (current) drug therapy: Secondary | ICD-10-CM

## 2021-08-27 DIAGNOSIS — G894 Chronic pain syndrome: Secondary | ICD-10-CM | POA: Diagnosis not present

## 2021-08-27 DIAGNOSIS — R030 Elevated blood-pressure reading, without diagnosis of hypertension: Secondary | ICD-10-CM

## 2021-08-27 MED ORDER — WEGOVY 0.25 MG/0.5ML ~~LOC~~ SOAJ: SUBCUTANEOUS | 0 refills | Status: DC

## 2021-08-27 NOTE — Progress Notes (Signed)
OFFICE VISIT  08/27/2021  CC:  Chief Complaint  Patient presents with   Obesity   Patient is a 56 y.o. female who presents for 3 month f/u GAD and recurrent MDD.  Also discuss obesity and wt management. A/P as of last visit: "Recurrent major depressive disorder, mild. GAD. Working on trying to get improvement in her response to medication--recent switch over from [employer] to Wellbutrin not resulting in any change for better or worse that this time. Will increase Wellbutrin to 300 mg a day and recheck in 2 months"  INTERIM HX: From a mood and anxiety points and he feels better--she is on Wellbutrin XL 300 mg a day and since last visit decided to restart Pristiq 100 mg a day. Regarding ADD, Pt states all is going well with the med at current dosing-->adderall 20mg  bid: much improved focus, concentration, task completion.  Less frustration, better multitasking, less impulsivity and restlessness.  Mood is stable. No side effects from the medication.  From a chronic myofascial pain standpoint she essentially suffers from pain constantly but has learned to live with it. She usually takes a tramadol in the evening.  Lyrica 150 mg twice daily has been significantly helpful  Her biggest concern is her weight, inability to lose weight. She walks daily but this has been curbed of late due to the heat. She eats meals infrequently but admits to snacking when she gets very hungry is a problem. She is interested in a trial of Saxenda.  PMP AWARE reviewed today: most recent rx for Adderall was filled 07/01/2021, #60, rx by me. Most recent pregabalin prescription was filled 08/24/2021, #60, prescription by me. Most recent tramadol prescription filled 05/01/2021, #120, prescription by me. No red flags.  ROS as above, plus--> no fevers, no CP, no SOB, no wheezing, no cough, no dizziness, some migraine HAs-->zomig very helpful. no rashes, no melena/hematochezia.  No polyuria or polydipsia.    No focal  weakness, paresthesias, or tremors.  No acute vision or hearing abnormalities.  No dysuria or unusual/new urinary urgency or frequency.  No recent changes in lower legs. No n/v/d or abd pain.  No palpitations.     Past Medical History:  Diagnosis Date   Adult ADHD 06/2017   DDD (degenerative disc disease), cervical 07/2018   VERY mild C5-6, right   GAD (generalized anxiety disorder)    with panic attacks   History of abnormal Pap smear    Repeats are always normal per pt (has GYN MD)   History of depression    Lexapro helped for a while, Pristique started 04/2011   Intercostal muscle tear 11/2013   R side   Migraines    Myofascial pain 2020   upper back/neck   Subclinical hyperthyroidism 2010   Some low TSHs and normal FT4 and T3   Vitamin D deficiency 11/2011    Past Surgical History:  Procedure Laterality Date   Unremarkable.      Outpatient Medications Prior to Visit  Medication Sig Dispense Refill   amphetamine-dextroamphetamine (ADDERALL) 20 MG tablet Take 1 tablet (20 mg total) by mouth 2 (two) times daily. 60 tablet 0   buPROPion (WELLBUTRIN XL) 300 MG 24 hr tablet TAKE 1 TABLET BY MOUTH EVERY DAY 90 tablet 1   desvenlafaxine (PRISTIQ) 100 MG 24 hr tablet Take 1 tablet (100 mg total) by mouth daily. 90 tablet 1   Multiple Vitamins-Calcium (ONE-A-DAY WOMENS PO) Take by mouth. Take 1 daily.     pregabalin (LYRICA) 150 MG  capsule Take 1 capsule (150 mg total) by mouth 2 (two) times daily. 60 capsule 5   traMADol (ULTRAM) 50 MG tablet TAKE 1 TO 2 TABLETS BY MOUTH TWICE A DAY AS NEEDED FOR PAIN 180 tablet 1   zolmitriptan (ZOMIG) 5 MG tablet TAKE 1 TAB AT ONSET OF MIGRAINE, MAY REPEAT DOSE IN 2 HOURS IF NEEDED. MAX IN 24H IS 10 MG. 8 tablet 6   valACYclovir (VALTREX) 1000 MG tablet 2 tabs po q12h x 2 doses at onset of outbreak of cold sores (Patient not taking: Reported on 05/30/2021) 20 tablet 1   No facility-administered medications prior to visit.    No Known  Allergies  ROS As per HPI  PE:    08/27/2021   10:02 AM 05/30/2021    3:42 PM 05/01/2021    8:00 AM  Vitals with BMI  Height 5' 7.126" 5\' 7"  5\' 7"   Weight 208 lbs 13 oz 216 lbs 214 lbs  BMI 32.58 33.82 33.51  Systolic 134 110  Diastolic 84 67 74  Pulse 96 105 95   Physical Exam  Gen: Alert, well appearing.  Patient is oriented to person, place, time, and situation. AFFECT: pleasant, lucid thought and speech. No further exam today.  LABS:  Last CBC Lab Results  Component Value Date   WBC 5.4 05/01/2021   HGB 13.4 05/01/2021   HCT 39.8 05/01/2021   MCV 89.8 05/01/2021   MCH 29.2 07/16/2018   RDW 13.0 05/01/2021   PLT 222.0 05/01/2021   Last metabolic panel Lab Results  Component Value Date   GLUCOSE 89 05/01/2021   NA 140 05/01/2021   K 4.3 05/01/2021   CL 105 05/01/2021   CO2 29 05/01/2021   BUN 14 05/01/2021   CREATININE 0.77 05/01/2021   CALCIUM 9.1 05/01/2021   PROT 6.5 05/01/2021   ALBUMIN 4.3 05/01/2021   BILITOT 0.8 05/01/2021   ALKPHOS 75 05/01/2021   AST 16 05/01/2021   ALT 15 05/01/2021   Last lipids Lab Results  Component Value Date   CHOL 226 (H) 05/01/2021   HDL 41.90 05/01/2021   LDLCALC 157 (H) 05/01/2021   LDLDIRECT 161.3 01/25/2013   TRIG 132.0 05/01/2021   CHOLHDL 5 05/01/2021   Last thyroid functions Lab Results  Component Value Date   TSH 0.91 05/01/2021   T3TOTAL 101 05/13/2017   IMPRESSION AND PLAN:  #1 obesity, BMI 32.5.  Comorbidity-->hypercholesterolemia. She continues to work on maximizing good dietary and exercise habits. Wegovy rx 0.25 q week--I gave her a list of others to ask her insurer about in case they deny Wegovy.  We discussed the option of phentermine if all of the other weight loss medications are denied by her insurer. We did discuss the fact that this may make her anxiety worse and may make her jittery but she would like to cautiously proceed with this medication if necessary.  #2 chronic pain  syndrome.  Myofascial pain consistent with fibromyalgia. She has responded pretty well to Lyrica 150 mg twice daily and she uses tramadol 50 mg as needed--typically 1 a day.  #3 GAD.  Moderately improved/stable on Pristiq 100 mg a day and Wellbutrin XL 300 mg a day.  #4 elevated blood pressure without diagnosis of hypertension. Just a mild elevation today at 134/84.  Looking back at blood pressures in the office over the years--> almost always well within normal range.  An After Visit Summary was printed and given to the patient.  FOLLOW UP: Return  in about 4 weeks (around 09/24/2021) for f/u wt mgmt --virtual okay.  Signed:  Santiago Bumpers, MD           08/27/2021

## 2021-09-02 MED ORDER — AMPHETAMINE-DEXTROAMPHETAMINE 20 MG PO TABS: 20.0000 mg | ORAL_TABLET | Freq: Two times a day (BID) | ORAL | 0 refills | Status: DC

## 2021-10-27 ENCOUNTER — Encounter: Payer: Self-pay | Admitting: Family Medicine

## 2021-10-28 MED ORDER — AMPHETAMINE-DEXTROAMPHETAMINE 20 MG PO TABS: 20.0000 mg | ORAL_TABLET | Freq: Two times a day (BID) | ORAL | 0 refills | Status: DC

## 2021-10-28 NOTE — Telephone Encounter (Signed)
Requesting: adderall Contract: 09/28/20 UDS: 09/28/20 Last Visit: 08/27/21 Next Visit: 4 week f/u wt mgmt Last Refill: 09/02/21(60,0)  Please Advise. Med pending

## 2021-10-29 ENCOUNTER — Other Ambulatory Visit: Payer: Self-pay | Admitting: Family Medicine

## 2021-10-29 NOTE — Telephone Encounter (Signed)
RF request for Pregabalin Contract: 09/28/20 UDS: 09/28/20 Last Visit:08/27/21 Next Visit: 4 week not completed Last Refill: 05/01/21(60,5)  Please Advise. Med pending

## 2021-11-15 ENCOUNTER — Encounter: Payer: Self-pay | Admitting: Family Medicine

## 2021-11-15 NOTE — Telephone Encounter (Signed)
I can prescribe either 1 of these but please check with your insurer about coverage first. Let me know.

## 2021-11-18 MED ORDER — SEMAGLUTIDE(0.25 OR 0.5MG/DOS) 2 MG/3ML ~~LOC~~ SOPN: PEN_INJECTOR | SUBCUTANEOUS | 0 refills | Status: DC

## 2021-11-18 NOTE — Telephone Encounter (Signed)
Heidi Leonard, Ozempic prescription sent. Diagnosis obesity class I (BMI 32).

## 2021-11-18 NOTE — Telephone Encounter (Signed)
Heidi Leonard (Key: [id number]) Ozempic (0.25 or 0.5 MG/DOSE) 2MG /3ML pen-injectors Status: PA RequestCreated: October 9th, 2023Sent: October 9th, 2023  Waiting for insurance determination.

## 2021-11-20 NOTE — Telephone Encounter (Signed)
PA denied. Pt does not meet criteria

## 2021-11-24 ENCOUNTER — Other Ambulatory Visit: Payer: Self-pay | Admitting: Family Medicine

## 2021-11-25 NOTE — Telephone Encounter (Signed)
Requesting: tramadol Contract: 09/28/20 UDS: 09/28/20 Last Visit: 08/27/21 Next Visit: 4 week f/u, not completed Last Refill: 05/01/21 (180,1)  Please Advise. Med pending

## 2021-11-25 NOTE — Telephone Encounter (Signed)
Tramadol prescription sent. Follow-up 1 to 2 months.

## 2021-11-26 NOTE — Telephone Encounter (Signed)
LM for pt regarding medication ?

## 2021-12-24 ENCOUNTER — Other Ambulatory Visit: Payer: Self-pay | Admitting: Family Medicine

## 2021-12-27 ENCOUNTER — Other Ambulatory Visit: Payer: Self-pay | Admitting: Family Medicine

## 2021-12-27 MED ORDER — AMPHETAMINE-DEXTROAMPHETAMINE 20 MG PO TABS: 20.0000 mg | ORAL_TABLET | Freq: Two times a day (BID) | ORAL | 0 refills | Status: DC

## 2021-12-27 NOTE — Telephone Encounter (Signed)
Requesting: adderall Contract: 09/28/20 UDS: 09/28/20 Last Visit: 08/27/21 Next Visit: 01/23/22 Last Refill: 10/28/21(60,0)  Please Advise. Med pending

## 2022-01-17 ENCOUNTER — Other Ambulatory Visit: Payer: Self-pay | Admitting: Family Medicine

## 2022-01-21 ENCOUNTER — Other Ambulatory Visit: Payer: Self-pay | Admitting: Family Medicine

## 2022-01-23 ENCOUNTER — Ambulatory Visit: Payer: BC Managed Care – PPO | Admitting: Family Medicine

## 2022-01-30 ENCOUNTER — Other Ambulatory Visit: Payer: Self-pay | Admitting: Family Medicine

## 2022-02-05 ENCOUNTER — Ambulatory Visit: Payer: BC Managed Care – PPO | Admitting: Family Medicine

## 2022-02-05 ENCOUNTER — Encounter: Payer: Self-pay | Admitting: Family Medicine

## 2022-02-05 VITALS — BP 110/74 | HR 88 | Temp 97.3°F | Ht 67.13 in | Wt 211.2 lb

## 2022-02-05 DIAGNOSIS — Z79899 Other long term (current) drug therapy: Secondary | ICD-10-CM | POA: Diagnosis not present

## 2022-02-05 DIAGNOSIS — F411 Generalized anxiety disorder: Secondary | ICD-10-CM

## 2022-02-05 DIAGNOSIS — M7918 Myalgia, other site: Secondary | ICD-10-CM

## 2022-02-05 DIAGNOSIS — F988 Other specified behavioral and emotional disorders with onset usually occurring in childhood and adolescence: Secondary | ICD-10-CM

## 2022-02-05 DIAGNOSIS — F339 Major depressive disorder, recurrent, unspecified: Secondary | ICD-10-CM

## 2022-02-05 MED ORDER — BUPROPION HCL ER (XL) 300 MG PO TB24: 300.0000 mg | ORAL_TABLET | Freq: Every day | ORAL | 1 refills | Status: DC

## 2022-02-05 MED ORDER — AMPHETAMINE-DEXTROAMPHETAMINE 20 MG PO TABS: 20.0000 mg | ORAL_TABLET | Freq: Two times a day (BID) | ORAL | 0 refills | Status: DC

## 2022-02-05 MED ORDER — TRAMADOL HCL 50 MG PO TABS: ORAL_TABLET | ORAL | 3 refills | Status: DC

## 2022-02-05 MED ORDER — DESVENLAFAXINE SUCCINATE ER 100 MG PO TB24: 100.0000 mg | ORAL_TABLET | Freq: Every day | ORAL | 1 refills | Status: DC

## 2022-02-05 NOTE — Progress Notes (Signed)
OFFICE VISIT  02/05/2022  CC:  Chief Complaint  Patient presents with   Medical Management of Chronic Issues   Patient is a 56 y.o. female who presents for follow-up adult ADD, anxiety and depression, chronic pain syndrome.  INTERIM HX: Heidi Leonard is doing okay. Some stress with work, taking care of of her elderly and chronically ill father who is in an assisted living facility. Having lots more headaches lately due to the stress.  These occur in various areas of her head and sometimes feel like migraines.  She feels like she needs her Zomig approximately 3 times a week.  ADD: Pt states all is going well with the med at current dosing--Adderall 20 mg twice daily: much improved focus, concentration, task completion.  Less frustration, better multitasking, less impulsivity and restlessness.  Mood and anxiety level are stable. No side effects from the medication.  Indication for chronic opioid: Chronic diffuse myofascial pain distributed into neck, back, hips, unresponsive to NSAIDs.  Use of tramadol and Lyrica improves her quality of life and functioning. Her pain is daily.  She does have to use the tramadol twice a day. Taking Lyrica as well.  Medication and dose: Tramadol 50 mg, 1 twice daily as needed. Lyrica 150 mg twice daily. # pills per month: #60 of each Last UDS date: 09/28/2020 Opioid Treatment Agreement signed (Y/N): Yes Opioid Treatment Agreement last reviewed with patient:  [facility] reviewed this encounter (include red flags): Yes PMP AWARE reviewed today: most recent rx for Lyrica was filled 01/30/2022, # 60, rx by me. Most recent prescription for Adderall was filled 12/27/2021, #60, prescription by me. Most recent prescription for tramadol was filled 11/25/2021, #120, prescription by me No red flags.  ROS as above, plus--> no fevers, no CP, no SOB, no wheezing, no cough, no dizziness, no rashes, no melena/hematochezia.  No polyuria or polydipsia.  No joint swelling or  redness.  No focal weakness, paresthesias, or tremors.  No acute vision or hearing abnormalities.  No dysuria or unusual/new urinary urgency or frequency.  No recent changes in lower legs. No n/v/d or abd pain.  No palpitations.    Past Medical History:  Diagnosis Date   Adult ADHD 06/2017   DDD (degenerative disc disease), cervical 07/2018   VERY mild C5-6, right   GAD (generalized anxiety disorder)    with panic attacks   History of abnormal Pap smear    Repeats are always normal per pt (has GYN MD)   History of depression    Lexapro helped for a while, Pristique started 04/2011   Intercostal muscle tear 11/2013   R side   Migraines    Myofascial pain 2020   upper back/neck   Subclinical hyperthyroidism 2010   Some low TSHs and normal FT4 and T3   Vitamin D deficiency 11/2011    Past Surgical History:  Procedure Laterality Date   Unremarkable.      Outpatient Medications Prior to Visit  Medication Sig Dispense Refill   Multiple Vitamins-Calcium (ONE-A-DAY WOMENS PO) Take by mouth. Take 1 daily.     pregabalin (LYRICA) 150 MG capsule TAKE 1 CAPSULE BY MOUTH TWICE A DAY 60 capsule 5   valACYclovir (VALTREX) 1000 MG tablet 2 tabs po q12h x 2 doses at onset of outbreak of cold sores 20 tablet 1   zolmitriptan (ZOMIG) 5 MG tablet TAKE 1 TAB AT ONSET OF MIGRAINE, MAY REPEAT DOSE IN 2 HOURS IF NEEDED. MAX IN 24H IS 10 MG. 8 tablet 6  Semaglutide,0.25 or 0.5MG /DOS, 2 MG/3ML SOPN 0.5 mg SQ q 7d (Patient not taking: Reported on 02/05/2022) 3 mL 0   amphetamine-dextroamphetamine (ADDERALL) 20 MG tablet Take 1 tablet (20 mg total) by mouth 2 (two) times daily. 60 tablet 0   buPROPion (WELLBUTRIN XL) 300 MG 24 hr tablet TAKE 1 TABLET BY MOUTH EVERY DAY 30 tablet 0   desvenlafaxine (PRISTIQ) 100 MG 24 hr tablet TAKE 1 TABLET BY MOUTH EVERY DAY 30 tablet 0   traMADol (ULTRAM) 50 MG tablet TAKE 1 TO 2 TABLETS BY MOUTH TWICE A DAY AS NEEDED FOR PAIN 120 tablet 0   No facility-administered  medications prior to visit.    No Known Allergies  Review of Systems As per HPI  PE:    02/05/2022    9:08 AM 08/27/2021   10:02 AM 05/30/2021    3:42 PM  Vitals with BMI  Height 5' 7.13" 5' 7.126" 5\' 7"   Weight 211 lbs 3 oz 208 lbs 13 oz 216 lbs  BMI 32.95 32.58 33.82  Systolic 110 134  Diastolic 74 84 67  Pulse 88 96 105     Physical Exam  Wt Readings from Last 2 Encounters:  02/05/22 211 lb 3.2 oz (95.8 kg)  08/27/21 208 lb 12.8 oz (94.7 kg)    Gen: alert, oriented x 4, affect pleasant.  Lucid thinking and conversation noted. HEENT: PERRLA, EOMI.   Neck: no LAD, mass, or thyromegaly. CV: RRR, no m/r/g LUNGS: CTA bilat, nonlabored. NEURO: no tremor or tics noted on observation.  Coordination intact. CN 2-12 grossly intact bilaterally, strength 5/5 in all extremeties.  No ataxia.   LABS:  Last CBC Lab Results  Component Value Date   WBC 5.4 05/01/2021   HGB 13.4 05/01/2021   HCT 39.8 05/01/2021   MCV 89.8 05/01/2021   MCH 29.2 07/16/2018   RDW 13.0 05/01/2021   PLT 222.0 05/01/2021   Last metabolic panel Lab Results  Component Value Date   GLUCOSE 89 05/01/2021   NA 140 05/01/2021   K 4.3 05/01/2021   CL 105 05/01/2021   CO2 29 05/01/2021   BUN 14 05/01/2021   CREATININE 0.77 05/01/2021   CALCIUM 9.1 05/01/2021   PROT 6.5 05/01/2021   ALBUMIN 4.3 05/01/2021   BILITOT 0.8 05/01/2021   ALKPHOS 75 05/01/2021   AST 16 05/01/2021   ALT 15 05/01/2021   Last lipids Lab Results  Component Value Date   CHOL 226 (H) 05/01/2021   HDL 41.90 05/01/2021   LDLCALC 157 (H) 05/01/2021   LDLDIRECT 161.3 01/25/2013   TRIG 132.0 05/01/2021   CHOLHDL 5 05/01/2021   Last thyroid functions Lab Results  Component Value Date   TSH 0.91 05/01/2021   T3TOTAL 101 05/13/2017   IMPRESSION AND PLAN:  #1 adult ADD, doing well on Adderall 20 mg twice daily. New prescription for #60 sent today. Urine drug screen today.  2.  GAD with recurrent major  depressive disorder--in remission. Continue Pristiq 100 mg a day and Wellbutrin XL 300 mg a day.  3.  Chronic pain syndrome--myofascial pain syndrome. Stable on Lyrica 150 mg twice daily and tramadol 50 to 100 mg twice daily as needed. Urine drug screen updated today.  4.  Chronic migraine syndrome. Stress her primary trigger. No improvement with trial of propranolol in the past. I want to avoid amitriptyline because of potential interaction with several of her other chronic medications. We discussed possible referral to neurology for consideration of Botox injections.  She defers for now but will let me know if she changes her mind in the future. Continue Zomig 5 mg for abortive medication.  An After Visit Summary was printed and given to the patient.  FOLLOW UP: Return in about 6 months (around 08/07/2022) for annual CPE (fasting). Signed:  Santiago Bumpers, MD           02/05/2022

## 2022-02-07 LAB — DRUG MONITORING PANEL 376104, URINE
Amphetamines: NEGATIVE ng/mL (ref <500)
Barbiturates: NEGATIVE ng/mL (ref <300)
Benzodiazepines: NEGATIVE ng/mL (ref <100)
Cocaine Metabolite: NEGATIVE ng/mL (ref <150)
Desmethyltramadol: 1097 ng/mL — ABNORMAL HIGH (ref <100)
Opiates: NEGATIVE ng/mL (ref <100)
Oxycodone: NEGATIVE ng/mL (ref <100)
Tramadol: 7589 ng/mL — ABNORMAL HIGH (ref <100)

## 2022-02-07 LAB — DM TEMPLATE

## 2022-02-21 ENCOUNTER — Encounter: Payer: Self-pay | Admitting: Family Medicine

## 2022-02-21 ENCOUNTER — Telehealth: Payer: Self-pay

## 2022-02-21 MED ORDER — NIRMATRELVIR/RITONAVIR (PAXLOVID)TABLET: 3.0000 | ORAL_TABLET | Freq: Two times a day (BID) | ORAL | 0 refills | Status: AC

## 2022-02-21 NOTE — Telephone Encounter (Signed)
Please Advise.    Day - Client TELEPHONE ADVICE RECORD [facility] Patient Name: Heidi Leonard Gender: Female DOB: Apr 27, 1965 Age: 57 Y 2 M 11 D Return Phone Number: [phone] (Primary), [phone] (Secondary) Address: City/ [place]/ [place]  [address] Client [facility] Day - Client Client Site [facility] - Day Provider Crissie Sickles - MD Contact Type Call Who Is Calling Patient / Member / Family / Caregiver Call Type Triage / Clinical Caller Name Gerarda Conklin Relationship To Patient Spouse Return Phone Number [phone] (Primary) Chief Complaint Cough Reason for Call Symptomatic / Request for [place] states his wife tested positive for COVID this morning and wants to know what to do. Caller states her legs are achy, cough, feels lateritic and it started Tuesday. Translation No Nurse Assessment Nurse: Raphael Gibney, RN, Vera Date/Time (Eastern Time): 02/21/2022 12:22:50 PM Confirm and document reason for call. If symptomatic, describe symptoms. ---Caller states spouse tested positive for COVID this am. Coughing and legs are achy. no fever. Sweating at night. symptoms started Wednesday am. Does the patient have any new or worsening symptoms? ---Yes Will a triage be completed? ---Yes Related visit to physician within the last 2 weeks? ---No Does the PT have any chronic conditions? (i.e. diabetes, asthma, this includes High risk factors for pregnancy, etc.) ---No Is the patient pregnant or possibly pregnant? (Ask all females between the ages of 28-55) ---No Is this a behavioral health or substance abuse call? ---No Guidelines Guideline Title Affirmed Question Affirmed Notes Nurse Date/Time (Eastern Time) COVID-19 - Diagnosed or Suspected [1] COVID-19 diagnosed by positive lab test (e.g., PCR, rapid self-test kit) AND [2] mild  symptoms (e.g., cough, fever, Raphael Gibney, RN, Vanita Ingles 02/21/2022 12:25:45 PM PLEASE NOTE: All timestamps contained within this report are represented as [place] Standard Time. CONFIDENTIALTY NOTICE: This fax transmission is intended only for the addressee. It contains information that is legally privileged, confidential or otherwise protected from use or disclosure. If you are not the intended recipient, you are strictly prohibited from reviewing, disclosing, copying using or disseminating any of this information or taking any action in reliance on or regarding this information. If you have received this fax in error, please notify us immediately by telephone so that we can arrange for its return to [place]. Phone: [phone], Toll-Free: [phone], Fax: [phone] Page: 2 of 2 Call Id: [id number] Guidelines Guideline Title Affirmed Question Affirmed Notes Nurse Date/Time Eilene Ghazi Time) others) AND [0] no complications or SOB Disp. Time Eilene Ghazi Time) Disposition Final User 02/21/2022 12:30:05 PM Home Care Yes Raphael Gibney, RN, Vanita Ingles Final Disposition 02/21/2022 12:30:05 PM [place], RN, Vanita Ingles Caller Disagree/Comply Comply Caller Understands Yes PreDisposition Call Doctor Care Advice Given Per Guideline HOME CARE: * You should be able to treat this at home. COVID-19 - HOW TO PROTECT OTHERS - WHEN YOU ARE SICK WITH COVID-19: * STAY HOME A MINIMUM OF 5 DAYS: People with MILD COVID-19 can STOP HOME ISOLATION AFTER 5 DAYS if (1) fever has been gone for 24 hours (without using fever medicine) AND (2) symptoms are better. Continue to wear a well-fitted mask for a full 10 days when around others. * WEAR A MASK FOR 10 DAYS: Wear a well-fitted mask for 10 full days any time you are around others inside your home or in public. Do not go to places where you are unable to wear a mask. CALL BACK IF: * Fever over 103 F (39.4  C) * Chest pain or difficulty breathing occurs * You become worse CARE  ADVICE given per COVID-19 - DIAGNOSED OR SUSPECTED (Adult) guideline. Comments User: Dannielle Burn, RN Date/Time Eilene Ghazi Time): 02/21/2022 12:29:44 PM pt wants to know about getting paxlovid. Please call pt back

## 2022-02-24 NOTE — Telephone Encounter (Signed)
Rx was given for Paxlovid

## 2022-03-07 ENCOUNTER — Other Ambulatory Visit: Payer: Self-pay | Admitting: Family Medicine

## 2022-03-07 DIAGNOSIS — B009 Herpesviral infection, unspecified: Secondary | ICD-10-CM

## 2022-04-07 ENCOUNTER — Encounter: Payer: Self-pay | Admitting: Family Medicine

## 2022-04-07 MED ORDER — AMPHETAMINE-DEXTROAMPHETAMINE 20 MG PO TABS: 20.0000 mg | ORAL_TABLET | Freq: Two times a day (BID) | ORAL | 0 refills | Status: DC

## 2022-04-07 NOTE — Telephone Encounter (Signed)
Requesting: adderall Contract: 02/05/22 UDS: 02/05/22 Last Visit: 02/05/22 Next Visit: 6 mo f/u CPE not scheduled Last Refill: 02/05/22 (60,0)  Please Advise. Med pending

## 2022-04-15 ENCOUNTER — Encounter: Payer: Self-pay | Admitting: Family Medicine

## 2022-04-18 ENCOUNTER — Encounter: Payer: Self-pay | Admitting: Family Medicine

## 2022-04-21 ENCOUNTER — Other Ambulatory Visit (HOSPITAL_COMMUNITY): Payer: Self-pay

## 2022-04-25 ENCOUNTER — Other Ambulatory Visit (HOSPITAL_COMMUNITY): Payer: Self-pay

## 2022-04-25 ENCOUNTER — Encounter: Payer: Self-pay | Admitting: Family Medicine

## 2022-04-25 MED ORDER — SEMAGLUTIDE(0.25 OR 0.5MG/DOS) 2 MG/3ML ~~LOC~~ SOPN: PEN_INJECTOR | SUBCUTANEOUS | 3 refills | Status: DC

## 2022-04-25 NOTE — Telephone Encounter (Signed)
Ok, rx done

## 2022-04-25 NOTE — Telephone Encounter (Signed)
Heidi Leonard (Key: [id number]) - [id number] 0.25MG /0.5ML auto-injectors Status: PA Request Created: March 1st, 2024 [phone] Sent: March 15th, 2024

## 2022-04-28 NOTE — Telephone Encounter (Signed)
Pharmacy Patient Advocate Encounter  Prior Authorization for Mancel Parsons has been approved by [employer] [place]).    PA # [id number] Effective dates: 04/28/2022 through 08/22/2022

## 2022-04-28 NOTE — Telephone Encounter (Signed)
Pt informed

## 2022-05-12 ENCOUNTER — Other Ambulatory Visit: Payer: Self-pay | Admitting: Family Medicine

## 2022-05-12 NOTE — Telephone Encounter (Addendum)
Requesting: Pregabalin  Contract: 02/05/22 UDS: 02/05/22 Last Visit: 02/05/22 Next Visit: 6 mo f/u not scheduled Last Refill: 10/30/21 (60,5)  Please Advise. Med pending

## 2022-05-28 MED ORDER — WEGOVY 0.5 MG/0.5ML ~~LOC~~ SOAJ: 0.5000 mg | SUBCUTANEOUS | 0 refills | Status: DC

## 2022-05-28 NOTE — Telephone Encounter (Signed)
Heidi Leonard 0.5 prescription sent

## 2022-05-29 ENCOUNTER — Other Ambulatory Visit: Payer: Self-pay | Admitting: Family Medicine

## 2022-06-05 ENCOUNTER — Ambulatory Visit: Payer: BC Managed Care – PPO | Admitting: Family Medicine

## 2022-06-11 ENCOUNTER — Encounter: Payer: Self-pay | Admitting: Family Medicine

## 2022-06-11 NOTE — Telephone Encounter (Signed)
noted 

## 2022-06-15 ENCOUNTER — Other Ambulatory Visit: Payer: Self-pay | Admitting: Family Medicine

## 2022-06-16 MED ORDER — AMPHETAMINE-DEXTROAMPHETAMINE 20 MG PO TABS: 20.0000 mg | ORAL_TABLET | Freq: Two times a day (BID) | ORAL | 0 refills | Status: DC

## 2022-06-16 NOTE — Telephone Encounter (Signed)
Requesting: adderall Contract: 02/05/22 UDS: 02/05/22 Last Visit: 02/05/22 Next Visit: 06/19/22 Last Refill: 04/07/22 (60,0)  Please Advise. Med pending

## 2022-06-19 ENCOUNTER — Ambulatory Visit: Payer: BC Managed Care – PPO | Admitting: Family Medicine

## 2022-06-19 ENCOUNTER — Encounter: Payer: Self-pay | Admitting: Family Medicine

## 2022-06-19 VITALS — BP 109/74 | HR 78 | Temp 98.0°F | Ht 67.13 in | Wt 200.0 lb

## 2022-06-19 DIAGNOSIS — Z7689 Persons encountering health services in other specified circumstances: Secondary | ICD-10-CM

## 2022-06-19 DIAGNOSIS — E7800 Pure hypercholesterolemia, unspecified: Secondary | ICD-10-CM | POA: Diagnosis not present

## 2022-06-19 DIAGNOSIS — F988 Other specified behavioral and emotional disorders with onset usually occurring in childhood and adolescence: Secondary | ICD-10-CM | POA: Diagnosis not present

## 2022-06-19 DIAGNOSIS — Z79899 Other long term (current) drug therapy: Secondary | ICD-10-CM

## 2022-06-19 DIAGNOSIS — N951 Menopausal and female climacteric states: Secondary | ICD-10-CM

## 2022-06-19 DIAGNOSIS — Z0000 Encounter for general adult medical examination without abnormal findings: Secondary | ICD-10-CM

## 2022-06-19 DIAGNOSIS — F411 Generalized anxiety disorder: Secondary | ICD-10-CM

## 2022-06-19 DIAGNOSIS — M7918 Myalgia, other site: Secondary | ICD-10-CM | POA: Diagnosis not present

## 2022-06-19 DIAGNOSIS — R6889 Other general symptoms and signs: Secondary | ICD-10-CM

## 2022-06-19 DIAGNOSIS — E669 Obesity, unspecified: Secondary | ICD-10-CM

## 2022-06-19 DIAGNOSIS — Z8659 Personal history of other mental and behavioral disorders: Secondary | ICD-10-CM

## 2022-06-19 LAB — CBC
HCT: 41.8 % (ref 36.0–46.0)
Hemoglobin: 14.5 g/dL (ref 12.0–15.0)
MCHC: 34.8 g/dL (ref 30.0–36.0)
MCV: 87.8 fl (ref 78.0–100.0)
Platelets: 253 10*3/uL (ref 150.0–400.0)
RBC: 4.76 Mil/uL (ref 3.87–5.11)
RDW: 13.2 % (ref 11.5–15.5)
WBC: 5.8 10*3/uL (ref 4.0–10.5)

## 2022-06-19 LAB — LIPID PANEL
Cholesterol: 203 mg/dL — ABNORMAL HIGH (ref 0–200)
HDL: 46.2 mg/dL (ref >39.00)
LDL Cholesterol: 122 mg/dL — ABNORMAL HIGH (ref 0–99)
NonHDL: 157.19
Total CHOL/HDL Ratio: 4
Triglycerides: 175 mg/dL — ABNORMAL HIGH (ref 0.0–149.0)
VLDL: 35 mg/dL (ref 0.0–40.0)

## 2022-06-19 LAB — TSH: TSH: 1.04 u[IU]/mL (ref 0.35–5.50)

## 2022-06-19 LAB — COMPREHENSIVE METABOLIC PANEL
ALT: 13 U/L (ref 0–35)
AST: 14 U/L (ref 0–37)
Albumin: 4.2 g/dL (ref 3.5–5.2)
Alkaline Phosphatase: 91 U/L (ref 39–117)
BUN: 16 mg/dL (ref 6–23)
CO2: 29 mEq/L (ref 19–32)
Calcium: 9.5 mg/dL (ref 8.4–10.5)
Chloride: 103 mEq/L (ref 96–112)
Creatinine, Ser: 0.9 mg/dL (ref 0.40–1.20)
GFR: 72.47 mL/min (ref >60.00)
Glucose, Bld: 68 mg/dL — ABNORMAL LOW (ref 70–99)
Potassium: 4 mEq/L (ref 3.5–5.1)
Sodium: 141 mEq/L (ref 135–145)
Total Bilirubin: 0.3 mg/dL (ref 0.2–1.2)
Total Protein: 6.8 g/dL (ref 6.0–8.3)

## 2022-06-19 MED ORDER — NALTREXONE HCL 50 MG PO TABS: 50.0000 mg | ORAL_TABLET | Freq: Every day | ORAL | 1 refills | Status: DC

## 2022-06-19 MED ORDER — SEMAGLUTIDE-WEIGHT MANAGEMENT 1 MG/0.5ML ~~LOC~~ SOAJ: 1.0000 mg | SUBCUTANEOUS | 3 refills | Status: DC

## 2022-06-19 NOTE — Progress Notes (Signed)
Office Note 06/19/2022  CC:  Chief Complaint  Patient presents with   Medical Management of Chronic Issues    Wegovy follow up and discuss low dose Naltrexone for fibromyalgia; pt is not fasting.    HPI:  Patient is a 57 y.o. female who is here for annual health maintenance exam and f/u adult ADD,hx of anx/dep, weight management, and myofascial neck/back pain syndrome .  Has been having more bad days than good over the last few months--situational, her father passed away and then her mother had significant illnesses requiring hospitalization. Things are improving though and she is beginning to return to her baseline stability regarding mood and anxiety levels. She is doing fine on the [facility] at the 0.5 mg dose the last several weeks.  She is pleased with her gradual weight loss.  She does have chronic waxing and waning hot flashes. She has gone about 6 months between periods last few years.  She continues to have significant pain due to her myofascial pain syndrome. Describes feeling knots in her shoulders and upper back and neck. She has recently read about using naltrexone for this and wants to try it. She typically takes a couple of tramadol every day lately in addition to her Lyrica 150 mg twice daily.  Pt states all is going well with the med at current dosing (Adderall 20 mg twice a day): much improved focus, concentration, task completion.  Less frustration, better multitasking, less impulsivity and restlessness.  Mood is stable. No side effects from the medication.   PMP AWARE reviewed today: most recent rx for pregabalin was filled 06/13/2022, # 60, rx by me. Most recent prescription for tramadol was filled 05/12/2022, #120, prescription by me. No red flags.  Past Medical History:  Diagnosis Date   Adult ADHD 06/2017   DDD (degenerative disc disease), cervical 07/2018   VERY mild C5-6, right   GAD (generalized anxiety disorder)    with panic attacks   History of  abnormal Pap smear    Repeats are always normal per pt (has GYN MD)   History of depression    Lexapro helped for a while, Pristique started 04/2011   Intercostal muscle tear 11/2013   R side   Migraines    Myofascial pain 2020   upper back/neck   Subclinical hyperthyroidism 2010   Some low TSHs and normal FT4 and T3   Vitamin D deficiency 11/2011    Past Surgical History:  Procedure Laterality Date   Unremarkable.      Family History  Problem Relation Age of Onset   Cancer Maternal Aunt    Healthy Mother    Other Father        Unknown Health status.   Hypertension Sister     Social History   Socioeconomic History   Marital status: Married    Spouse name: Not on file   Number of children: Not on file   Years of education: Not on file   Highest education level: Not on file  Occupational History   Not on file  Tobacco Use   Smoking status: Former   Smokeless tobacco: Never   Tobacco comments:    Quit approx [5] years ago.  Substance and Sexual Activity   Alcohol use: Yes   Drug use: No   Sexual activity: Yes  Other Topics Concern   Not on file  Social History Narrative   Married, 3 step children.   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  Originally from [place].   Former cig smoker, quit 2009.  Occ alcohol.  No drugs.   Works out with [employer] and walks several days a week.      Social Determinants of Health   Financial Resource Strain: Not on file  Food Insecurity: Not on file  Transportation Needs: Not on file  Physical Activity: Not on file  Stress: Not on file  Social Connections: Not on file  Intimate Partner Violence: Not on file    Outpatient Medications Prior to Visit  Medication Sig Dispense Refill   amphetamine-dextroamphetamine (ADDERALL) 20 MG tablet Take 1 tablet (20 mg total) by mouth 2 (two) times daily. 60 tablet 0   buPROPion (WELLBUTRIN XL) 300 MG 24 hr tablet Take 1 tablet (300 mg total) by mouth daily. 90  tablet 1   desvenlafaxine (PRISTIQ) 100 MG 24 hr tablet Take 1 tablet (100 mg total) by mouth daily. 90 tablet 1   Multiple Vitamins-Calcium (ONE-A-DAY WOMENS PO) Take by mouth. Take 1 daily.     pregabalin (LYRICA) 150 MG capsule TAKE 1 CAPSULE BY MOUTH TWICE A DAY 60 capsule 3   zolmitriptan (ZOMIG) 5 MG tablet TAKE 1 TAB AT ONSET OF MIGRAINE, MAY REPEAT DOSE IN 2 HOURS IF NEEDED. MAX IN 24H IS 10 MG. 8 tablet 6   Semaglutide-Weight Management (WEGOVY) 0.5 MG/0.5ML SOAJ Inject 0.5 mg into the skin once a week. 2 mL 0   traMADol (ULTRAM) 50 MG tablet TAKE 1 TO 2 TABLETS BY MOUTH TWICE A DAY AS NEEDED FOR PAIN 120 tablet 3   valACYclovir (VALTREX) 1000 MG tablet TAKE 2 TABLETS BY MOUTH EVERY 12 HOURS X 2 DOSES AT ONSET OF OUTBREAK OF COLD SORES (Patient not taking: Reported on 06/19/2022) 20 tablet 1   No facility-administered medications prior to visit.    No Known Allergies  Review of Systems  Constitutional:  Negative for appetite change, chills, fatigue and fever.  HENT:  Negative for congestion, dental problem, ear pain and sore throat.   Eyes:  Negative for discharge, redness and visual disturbance.  Respiratory:  Negative for cough, chest tightness, shortness of breath and wheezing.   Cardiovascular:  Negative for chest pain, palpitations and leg swelling.  Gastrointestinal:  Negative for abdominal pain, blood in stool, diarrhea, nausea and vomiting.  Genitourinary:  Negative for difficulty urinating, dysuria, flank pain, frequency, hematuria and urgency.  Musculoskeletal:  Positive for myalgias. Negative for arthralgias, back pain, joint swelling and neck stiffness.  Skin:  Negative for pallor and rash.  Neurological:  Negative for dizziness, speech difficulty, weakness and headaches.  Hematological:  Negative for adenopathy. Does not bruise/bleed easily.  Psychiatric/Behavioral:  Positive for sleep disturbance. Negative for confusion. The patient is not nervous/anxious.      PE;    06/19/2022    8:57 AM 02/05/2022    9:08 AM 08/27/2021   10:02 AM  Vitals with BMI  Height 5' 7.13" 5' 7.13" 5' 7.126"  Weight 200 lbs 211 lbs 3 oz 208 lbs 13 oz  BMI 31.21 32.95 32.58  Systolic 109 110 119  Diastolic 74 74 84  Pulse 78 88 96   Exam chaperoned by Sammuel Cooper, CMA Gen: Alert, well appearing.  Patient is oriented to person, place, time, and situation. AFFECT: pleasant, lucid thought and speech. ENT: Ears: EACs clear, normal epithelium.  TMs with good light reflex and landmarks bilaterally.  Eyes: no injection, icteris, swelling, or exudate.  EOMI, PERRLA. Nose: no drainage or turbinate edema/swelling.  No injection or focal lesion.  Mouth: lips without lesion/swelling.  Oral mucosa pink and moist.  Dentition intact and without obvious caries or gingival swelling.  Oropharynx without erythema, exudate, or swelling.  Neck: supple/nontender.  No LAD, mass, or TM.  Carotid pulses 2+ bilaterally, without bruits. CV: RRR, no m/r/g.   LUNGS: CTA bilat, nonlabored resps, good aeration in all lung fields. ABD: soft, NT, ND, BS normal.  No hepatospenomegaly or mass.  No bruits. EXT: no clubbing, cyanosis, or edema.  Musculoskeletal: no joint swelling, erythema, warmth, or tenderness.  ROM of all joints intact. Skin - no sores or suspicious lesions or rashes or color changes  Pertinent labs:  Lab Results  Component Value Date   TSH 0.91 05/01/2021   Lab Results  Component Value Date   WBC 5.4 05/01/2021   HGB 13.4 05/01/2021   HCT 39.8 05/01/2021   MCV 89.8 05/01/2021   PLT 222.0 05/01/2021   Lab Results  Component Value Date   CREATININE 0.77 05/01/2021   BUN 14 05/01/2021   NA 140 05/01/2021   K 4.3 05/01/2021   CL 105 05/01/2021   CO2 29 05/01/2021   Lab Results  Component Value Date   ALT 15 05/01/2021   AST 16 05/01/2021   ALKPHOS 75 05/01/2021   BILITOT 0.8 05/01/2021   Lab Results  Component Value Date   CHOL 226 (H) 05/01/2021   Lab  Results  Component Value Date   HDL 41.90 05/01/2021   Lab Results  Component Value Date   LDLCALC 157 (H) 05/01/2021   Lab Results  Component Value Date   TRIG 132.0 05/01/2021   Lab Results  Component Value Date   CHOLHDL 5 05/01/2021    ASSESSMENT AND PLAN:   #1 health maintenance exam: Reviewed age and gender appropriate health maintenance issues (prudent diet, regular exercise, health risks of tobacco and excessive alcohol, use of seatbelts, fire alarms in home, use of sunscreen).  Also reviewed age and gender appropriate health screening as well as vaccine recommendations. Vaccines: all UTD. Labs: HP ordered Cervical ca screening: per GYN--- needs to make appointment. Breast ca screening: per GYN--she needs to make appointment. Colon ca screening: 02/2020 cologuard neg, rpt 02/2023  #2 weight management, class I obesity. Improving gradually on Wegovy. Prescription for 1 mg weekly dose sent in today to start when she finishes her 1 month of 0.5 mg weekly dosing.  3.  Myofascial pain syndrome--not well-controlled. Continue Lyrica 150 mg twice daily.  We are going to try naltrexone 50 mg a day.  However, I discussed with her that this could precipitate withdrawal from tramadol..  She will wean off the tramadol over the next couple weeks and then start the naltrexone.  4. Adult ADD. Stable. Continue adderall 20mg  bid.  5.  GAD, hx of MDD. Cont pristiq 100mg  qd and wellbutrin xl 300 qd.  6.  Heat intol/hot flashes. Likely combo of all the meds she takes that affect serotonin levels PLUS perimenopausal hormonal effects. Monitor.  She'll discuss with GYN soon.   An After Visit Summary was printed and given to the patient.  FOLLOW UP:  Return in about 6 weeks (around 07/31/2022) for f/u pain/naltrexone.  Signed:  Santiago Bumpers, MD           06/19/2022

## 2022-06-19 NOTE — Patient Instructions (Signed)

## 2022-06-23 ENCOUNTER — Telehealth: Payer: Self-pay | Admitting: Family Medicine

## 2022-06-23 NOTE — Telephone Encounter (Deleted)
  Pt is needing low does of Naltrexone perhaps starting off at .5 She was informed that she would be starting with a low dose. She also states the medication needs to go to a compound pharmacy which she reports one is located in [place].    

## 2022-06-23 NOTE — Telephone Encounter (Signed)
Pt is requesting low dose of Naltrexon sent to [facility] on [address]. Please advise.

## 2022-06-23 NOTE — Telephone Encounter (Signed)
  Pt is needing low does of Naltrexone perhaps starting off at .5 She was informed that she would be starting with a low dose. She also states the medication needs to go to a compound pharmacy which she reports one is located in [place].

## 2022-06-24 NOTE — Telephone Encounter (Signed)
No further action needed.

## 2022-06-26 ENCOUNTER — Encounter: Payer: Self-pay | Admitting: Family Medicine

## 2022-07-15 NOTE — Telephone Encounter (Signed)
Please call [facility] and prescribe naltrexone 1 mg. 1 tab p.o. daily, #30, refill x 1. Of note, this is not a controlled substance so you should not have any problem doing it.  Epic does not have naltrexone in this dosing so we cannot send it electronically.

## 2022-07-15 NOTE — Telephone Encounter (Signed)
Prescription called in at [facility] with directions below.  Pt notified

## 2022-07-22 ENCOUNTER — Other Ambulatory Visit: Payer: Self-pay | Admitting: Family Medicine

## 2022-07-23 ENCOUNTER — Telehealth: Payer: Self-pay

## 2022-07-23 ENCOUNTER — Other Ambulatory Visit: Payer: Self-pay | Admitting: Family Medicine

## 2022-07-23 ENCOUNTER — Other Ambulatory Visit (HOSPITAL_COMMUNITY): Payer: Self-pay

## 2022-07-23 NOTE — Telephone Encounter (Signed)
Please assist with PA.

## 2022-07-23 NOTE — Telephone Encounter (Signed)
Noted  

## 2022-07-23 NOTE — Telephone Encounter (Signed)
Please do prior Auth for the 1 mg/week dosing

## 2022-07-23 NOTE — Telephone Encounter (Signed)
*  Primary  PA request received for Wegovy 1MG /0.5ML auto-injectors  PA submitted to [facility] and is pending additional questions/determination  Key: [id number]

## 2022-07-23 NOTE — Telephone Encounter (Signed)
PA submitted, will update in additional encounter  

## 2022-07-23 NOTE — Telephone Encounter (Signed)
Pharmacy sent request for higher dose or PA to be done for 1mg  Wegovy.   Please further advise.

## 2022-07-23 NOTE — Telephone Encounter (Signed)
PA submitted by prior auth team today. Waiting for determination

## 2022-07-24 ENCOUNTER — Other Ambulatory Visit: Payer: Self-pay | Admitting: Family Medicine

## 2022-07-25 NOTE — Telephone Encounter (Signed)
Patient Advocate Encounter  Questions generated, answered, and submitted 

## 2022-07-28 ENCOUNTER — Other Ambulatory Visit: Payer: Self-pay | Admitting: Family Medicine

## 2022-07-30 ENCOUNTER — Telehealth: Payer: Self-pay | Admitting: Family Medicine

## 2022-07-30 ENCOUNTER — Other Ambulatory Visit: Payer: Self-pay | Admitting: Family Medicine

## 2022-07-30 ENCOUNTER — Other Ambulatory Visit (HOSPITAL_COMMUNITY): Payer: Self-pay

## 2022-07-30 NOTE — Telephone Encounter (Signed)
Patient is currently taking Wegovy .75 which she no longer has anymore of.the pharmacy [facility] has asked her if she would like to move up to the next mg. She should now be taking 1 mg, but the pharmacy keeps denying the request. She needs a new prescription for the 1 mg which she should be taking this sat. If any additional information is needed please give the patient a call.

## 2022-07-30 NOTE — Telephone Encounter (Signed)
Pt's insurance will only cover 1 fill of each dosage of Wegovy.  Please further advise

## 2022-07-30 NOTE — Telephone Encounter (Signed)
PA initiated via CMM. PA cancelled. Authorization on file, effective through 08/22/22.     Looks like patient filled 1mg  in May. Insurance will only allow 1 fill of each dose. Ran test claim for 1.7mg , received paid claim.

## 2022-07-30 NOTE — Telephone Encounter (Signed)
Please advise if PA determination available

## 2022-07-31 MED ORDER — WEGOVY 1.7 MG/0.75ML ~~LOC~~ SOAJ: 1.7000 mg | SUBCUTANEOUS | 1 refills | Status: DC

## 2022-07-31 NOTE — Patient Instructions (Signed)

## 2022-07-31 NOTE — Telephone Encounter (Signed)
Rx for 1.7mg sent

## 2022-07-31 NOTE — Telephone Encounter (Signed)
Please note this request does not require prior authorization review at this time due to an approval on file, effective through 08/22/2022.

## 2022-07-31 NOTE — Addendum Note (Signed)
Addended by: Jeoffrey Massed on: 07/31/2022 07:39 AM   Modules accepted: Orders

## 2022-08-01 ENCOUNTER — Ambulatory Visit: Payer: BC Managed Care – PPO | Admitting: Family Medicine

## 2022-08-01 ENCOUNTER — Encounter: Payer: Self-pay | Admitting: Family Medicine

## 2022-08-01 VITALS — BP 120/83 | HR 98 | Wt 192.0 lb

## 2022-08-01 DIAGNOSIS — M25559 Pain in unspecified hip: Secondary | ICD-10-CM | POA: Diagnosis not present

## 2022-08-01 DIAGNOSIS — M7918 Myalgia, other site: Secondary | ICD-10-CM

## 2022-08-01 DIAGNOSIS — Z7689 Persons encountering health services in other specified circumstances: Secondary | ICD-10-CM

## 2022-08-01 MED ORDER — AMPHETAMINE-DEXTROAMPHETAMINE 20 MG PO TABS: 20.0000 mg | ORAL_TABLET | Freq: Two times a day (BID) | ORAL | 0 refills | Status: DC

## 2022-08-01 NOTE — Progress Notes (Signed)
OFFICE VISIT  08/01/2022  CC:  Chief Complaint  Patient presents with   Pain Management    F/u.     Patient is a 57 y.o. female who presents for 6-week follow-up myofascial pain syndrome and weight management. A/P as of last visit: "2 weight management, class I obesity. Improving gradually on Wegovy. Prescription for 1 mg weekly dose sent in today to start when she finishes her 1 month of 0.5 mg weekly dosing.   3.  Myofascial pain syndrome--not well-controlled. Continue Lyrica 150 mg twice daily.  We are going to try naltrexone 50 mg a day.  However, I discussed with her that this could precipitate withdrawal from tramadol..  She will wean off the tramadol over the next couple weeks and then start the naltrexone.   4. Adult ADD. Stable. Continue adderall 20mg  bid.   5.  GAD, hx of MDD. Cont pristiq 100mg  qd and wellbutrin xl 300 qd.   6.  Heat intol/hot flashes. Likely combo of all the meds she takes that affect serotonin levels PLUS perimenopausal hormonal effects. Monitor.  She'll discuss with GYN soon."  INTERIM HX: Heidi Leonard is happy to say that she is doing much better regarding her myofascial pain!  Additionally, she is having less migraine headaches. The only drawback is being off the tramadol she is having more problems with her hip bursitis pain at night when trying to sleep. During the day this does not bother her.  Wegovy at the 1 mg dosing elicited no side effects. She is starting the 1.7 mg weekly dose at any time now.  Past Medical History:  Diagnosis Date   Adult ADHD 06/2017   DDD (degenerative disc disease), cervical 07/2018   VERY mild C5-6, right   GAD (generalized anxiety disorder)    with panic attacks   History of abnormal Pap smear    Repeats are always normal per pt (has GYN MD)   History of depression    Lexapro helped for a while, Pristique started 04/2011   Intercostal muscle tear 11/2013   R side   Migraines    Myofascial pain 2020    upper back/neck   Subclinical hyperthyroidism 2010   Some low TSHs and normal FT4 and T3   Vitamin D deficiency 11/2011    Past Surgical History:  Procedure Laterality Date   Unremarkable.      Outpatient Medications Prior to Visit  Medication Sig Dispense Refill   buPROPion (WELLBUTRIN XL) 300 MG 24 hr tablet Take 1 tablet (300 mg total) by mouth daily. 90 tablet 1   desvenlafaxine (PRISTIQ) 100 MG 24 hr tablet Take 1 tablet (100 mg total) by mouth daily. 90 tablet 1   Multiple Vitamins-Calcium (ONE-A-DAY WOMENS PO) Take by mouth. Take 1 daily.     NALTREXONE HCL PO Take 3 mg by mouth daily.     Semaglutide-Weight Management (WEGOVY) 1.7 MG/0.75ML SOAJ Inject 1.7 mg into the skin once a week. 3 mL 1   zolmitriptan (ZOMIG) 5 MG tablet TAKE 1 TAB AT ONSET OF MIGRAINE, MAY REPEAT DOSE IN 2 HOURS IF NEEDED. MAX IN 24H IS 10 MG. 8 tablet 6   naltrexone (DEPADE) 50 MG tablet Take 1 tablet (50 mg total) by mouth daily. 30 tablet 1   Semaglutide-Weight Management 1 MG/0.5ML SOAJ Inject 1 mg into the skin once a week. 2 mL 3   pregabalin (LYRICA) 150 MG capsule TAKE 1 CAPSULE BY MOUTH TWICE A DAY (Patient not taking: Reported on 08/01/2022) 60  capsule 3   valACYclovir (VALTREX) 1000 MG tablet TAKE 2 TABLETS BY MOUTH EVERY 12 HOURS X 2 DOSES AT ONSET OF OUTBREAK OF COLD SORES (Patient not taking: Reported on 06/19/2022) 20 tablet 1   amphetamine-dextroamphetamine (ADDERALL) 20 MG tablet Take 1 tablet (20 mg total) by mouth 2 (two) times daily. 60 tablet 0   No facility-administered medications prior to visit.    No Known Allergies  Review of Systems As per HPI  PE:    08/01/2022    9:03 AM 06/19/2022    8:57 AM 02/05/2022    9:08 AM  Vitals with BMI  Height  5' 7.13" 5' 7.13"  Weight 192 lbs 200 lbs 211 lbs 3 oz  BMI  31.21 32.95  Systolic 120 109 161  Diastolic 83 74 74  Pulse 98 78 88  Body mass index is 29.96 kg/m.    Physical Exam  Gen: Alert, well appearing.  Patient is  oriented to person, place, time, and situation. AFFECT: pleasant, lucid thought and speech. No further exam today  LABS:  Last CBC Lab Results  Component Value Date   WBC 5.8 06/19/2022   HGB 14.5 06/19/2022   HCT 41.8 06/19/2022   MCV 87.8 06/19/2022   MCH 29.2 07/16/2018   RDW 13.2 06/19/2022   PLT 253.0 06/19/2022   Last metabolic panel Lab Results  Component Value Date   GLUCOSE 68 (L) 06/19/2022   NA 141 06/19/2022   K 4.0 06/19/2022   CL 103 06/19/2022   CO2 29 06/19/2022   BUN 16 06/19/2022   CREATININE 0.90 06/19/2022   CALCIUM 9.5 06/19/2022   PROT 6.8 06/19/2022   ALBUMIN 4.2 06/19/2022   BILITOT 0.3 06/19/2022   ALKPHOS 91 06/19/2022   AST 14 06/19/2022   ALT 13 06/19/2022   Last thyroid functions Lab Results  Component Value Date   TSH 1.04 06/19/2022   T3TOTAL 101 05/13/2017   IMPRESSION AND PLAN:  #1 fibromyalgia/myofascial pain: Much improved with naltrexone.  She is up to taking 2 mg a day.  Will move up to the 3 mg dosing (compounded low-dose tabs--> 2 mg tab +1 mg tab daily, #30 of each, refill x 2 of each.  #2 weight management,, class I obesity.   Doing well on Wegovy as we titrate up.  She is about to start the 1.7 mg weekly dose. She is down 8 pounds since last visit 6 weeks ago.  #3 lateral hip pain syndrome bilaterally.  Essentially trochanteric pain. Only bothers her at night.  Unable to take tramadol now since she is on naltrexone. She has had injections in the past and they were too painful--she does not want another. She will try an evening dose of Tylenol or NSAID.  An After Visit Summary was printed and given to the patient.  FOLLOW UP: Return in about 2 months (around 10/01/2022) for routine chronic illness f/u.  Signed:  Santiago Bumpers, MD           08/01/2022

## 2022-08-25 MED ORDER — WEGOVY 2.4 MG/0.75ML ~~LOC~~ SOAJ: 2.4000 mg | SUBCUTANEOUS | 1 refills | Status: DC

## 2022-08-25 NOTE — Telephone Encounter (Signed)
[facility] prescription sent. CMA Please call in prescription for naltrexone 4 mg tabs to [facility] in [place]. 1 tab daily, #30, refill x 3. ([facility] does not have this strength/mg tabs so we have to call pharmacy and order). thx

## 2022-08-25 NOTE — Addendum Note (Signed)
Addended by: Jeoffrey Massed on: 08/25/2022 05:10 PM   Modules accepted: Orders

## 2022-08-27 NOTE — Telephone Encounter (Signed)
Patient called and is looking to have a status update on the Wegovy. However she did say the compound medication should be sent to [facility]. And they [facility]  Please give the patient a call to let her know about the [facility].

## 2022-08-28 ENCOUNTER — Telehealth: Payer: Self-pay

## 2022-08-28 NOTE — Telephone Encounter (Signed)
Message sent to prior auth team for assistance with [facility] PA

## 2022-08-28 NOTE — Telephone Encounter (Signed)
Please complete PA for Wegovy.

## 2022-08-29 ENCOUNTER — Other Ambulatory Visit: Payer: Self-pay

## 2022-08-29 NOTE — Telephone Encounter (Signed)
Pls call [facility] and rx naltrexone 4mg , 1 tab po bid, #60, RF x1

## 2022-08-29 NOTE — Telephone Encounter (Signed)
Pharmacy called and given verbal over the phone. Pt advised via MyChart.

## 2022-08-29 NOTE — Telephone Encounter (Signed)
error 

## 2022-08-29 NOTE — Telephone Encounter (Signed)
Patient calling to speak with Dr. Milinda Cave.  She states she has been speaking with someone via mychart and she is getting no where.  She wants to explain what is going on with prescription. She said it is regarding 2 prescriptions, Reginal Lutes being one of them, Naltrexone for other one.  The one med is not requiring prior auth.  Naltrexone and has not been sent in yet. I told her that I could send message to his Clinical Team.

## 2022-08-29 NOTE — Telephone Encounter (Signed)
Unable to check PA website for update at this time.

## 2022-08-31 ENCOUNTER — Other Ambulatory Visit: Payer: Self-pay | Admitting: Family Medicine

## 2022-09-01 NOTE — Telephone Encounter (Signed)
Per separate encounter, PA was submitted on 08/28/22. Key: [id number]. Status pending.

## 2022-09-01 NOTE — Telephone Encounter (Signed)
Patient came to the office to discuss PA. Pt was shown PA that was submitted via CoverMyMeds and received help from front desk to find PA sent to pharmacy folder. Advised pt all questions on the [employer] PA form were the same completed on CoverMyMeds as well. She called her insurance pharmacy help desk to determine if they showed receiving a PA from our office. They confirmed it was received as of 7/18 and is still in progress. It will take another 24-48 hours for [place] to get determination status. Pt understood we will update her once available.

## 2022-09-02 NOTE — Telephone Encounter (Signed)
Received fax for additional information. Completed and faxed back to insurance.

## 2022-09-10 ENCOUNTER — Encounter (INDEPENDENT_AMBULATORY_CARE_PROVIDER_SITE_OTHER): Payer: Self-pay

## 2022-09-10 ENCOUNTER — Other Ambulatory Visit (HOSPITAL_COMMUNITY): Payer: Self-pay

## 2022-09-16 ENCOUNTER — Other Ambulatory Visit: Payer: Self-pay | Admitting: Family Medicine

## 2022-09-16 DIAGNOSIS — Z1231 Encounter for screening mammogram for malignant neoplasm of breast: Secondary | ICD-10-CM

## 2022-09-29 ENCOUNTER — Encounter: Payer: Self-pay | Admitting: Family Medicine

## 2022-09-29 MED ORDER — AMPHETAMINE-DEXTROAMPHETAMINE 20 MG PO TABS: 20.0000 mg | ORAL_TABLET | Freq: Two times a day (BID) | ORAL | 0 refills | Status: DC

## 2022-09-29 MED ORDER — PREGABALIN 150 MG PO CAPS: 150.0000 mg | ORAL_CAPSULE | Freq: Two times a day (BID) | ORAL | 1 refills | Status: DC

## 2022-09-29 NOTE — Telephone Encounter (Signed)
Pt is requesting 90 d/s instead of 60.  Last OV 08/01/22 Next OV 10/10/22  Please fill, if appropriate.

## 2022-10-06 ENCOUNTER — Ambulatory Visit: Payer: BC Managed Care – PPO | Admitting: Family Medicine

## 2022-10-10 ENCOUNTER — Ambulatory Visit: Payer: BC Managed Care – PPO | Admitting: Family Medicine

## 2022-10-15 NOTE — Patient Instructions (Incomplete)

## 2022-10-16 ENCOUNTER — Encounter: Payer: Self-pay | Admitting: Family Medicine

## 2022-10-16 ENCOUNTER — Ambulatory Visit: Payer: BC Managed Care – PPO | Admitting: Family Medicine

## 2022-10-16 VITALS — BP 106/72 | HR 94 | Temp 97.2°F | Ht 67.13 in | Wt 185.6 lb

## 2022-10-16 DIAGNOSIS — F988 Other specified behavioral and emotional disorders with onset usually occurring in childhood and adolescence: Secondary | ICD-10-CM

## 2022-10-16 DIAGNOSIS — M7631 Iliotibial band syndrome, right leg: Secondary | ICD-10-CM | POA: Diagnosis not present

## 2022-10-16 DIAGNOSIS — M7918 Myalgia, other site: Secondary | ICD-10-CM

## 2022-10-16 DIAGNOSIS — M25559 Pain in unspecified hip: Secondary | ICD-10-CM | POA: Diagnosis not present

## 2022-10-16 DIAGNOSIS — Z7689 Persons encountering health services in other specified circumstances: Secondary | ICD-10-CM

## 2022-10-16 NOTE — Progress Notes (Signed)
OFFICE VISIT  10/16/2022  CC:  Chief Complaint  Patient presents with   Medical Management of Chronic Issues    Patient is a 57 y.o. female who presents for 2-1/64-month follow-up fibromyalgia/myofascial pain, weight management, and adult ADD. A/P as of last visit: "#1 fibromyalgia/myofascial pain: Much improved with naltrexone.  She is up to taking 2 mg a day.  Will move up to the 3 mg dosing (compounded low-dose tabs--> 2 mg tab +1 mg tab daily, #30 of each, refill x 2 of each.   #2 weight management,, class I obesity.   Doing well on Wegovy as we titrate up.  She is about to start the 1.7 mg weekly dose. She is down 8 pounds since last visit 6 weeks ago.   #3 lateral hip pain syndrome bilaterally.  Essentially trochanteric pain. Only bothers her at night.  Unable to take tramadol now since she is on naltrexone. She has had injections in the past and they were too painful--she does not want another. She will try an evening dose of Tylenol or NSAID."  INTERIM HX: Feeling very well other than lateral hip pain bilaterally. Lately the right lateral hip pain has been extending down the right thigh to the knee level.  No paresthesias or weakness. Overall her myofascial pain is very well-controlled now on 4 mg of naltrexone twice a day. This is also decreased the frequency and severity of her migraines.  She is doing well on Saxenda 2.4 mg SQ weekly.  Pt states all is going well with the med at current dosing (adderall 20mg  bid): much improved focus, concentration, task completion.  Less frustration, better multitasking, less impulsivity and restlessness.  Mood is stable. No side effects from the medication.   PMP AWARE reviewed today: most recent rx for Adderall was filled 09/29/2022, # 180, rx by me. No red flags.  ROS as above, plus--> no fevers, no CP, no SOB, no wheezing, no cough, no dizziness, no HAs, no rashes, no melena/hematochezia.  No polyuria or polydipsia. No focal  weakness, paresthesias, or tremors.  No acute vision or hearing abnormalities.  No dysuria or unusual/new urinary urgency or frequency.  No recent changes in lower legs. No n/v/d or abd pain.  No palpitations.    Past Medical History:  Diagnosis Date   Adult ADHD 06/2017   DDD (degenerative disc disease), cervical 07/2018   VERY mild C5-6, right   GAD (generalized anxiety disorder)    with panic attacks   History of abnormal Pap smear    Repeats are always normal per pt (has GYN MD)   History of depression    Lexapro helped for a while, Pristique started 04/2011   Intercostal muscle tear 11/2013   R side   Migraines    Myofascial pain 2020   upper back/neck   Subclinical hyperthyroidism 2010   Some low TSHs and normal FT4 and T3   Vitamin D deficiency 11/2011    Past Surgical History:  Procedure Laterality Date   Unremarkable.      Outpatient Medications Prior to Visit  Medication Sig Dispense Refill   amphetamine-dextroamphetamine (ADDERALL) 20 MG tablet Take 1 tablet (20 mg total) by mouth 2 (two) times daily. 180 tablet 0   buPROPion (WELLBUTRIN XL) 300 MG 24 hr tablet TAKE 1 TABLET BY MOUTH EVERY DAY 90 tablet 0   desvenlafaxine (PRISTIQ) 100 MG 24 hr tablet TAKE 1 TABLET BY MOUTH EVERY DAY 90 tablet 0   Multiple Vitamins-Calcium (ONE-A-DAY WOMENS PO)  Take by mouth. Take 1 daily.     NALTREXONE HCL PO Take 1 mg by mouth daily. 4 mg daily as of 10/2022     pregabalin (LYRICA) 150 MG capsule Take 1 capsule (150 mg total) by mouth 2 (two) times daily. 180 capsule 1   Semaglutide-Weight Management (WEGOVY) 2.4 MG/0.75ML SOAJ Inject 2.4 mg into the skin once a week. 3 mL 1   valACYclovir (VALTREX) 1000 MG tablet TAKE 2 TABLETS BY MOUTH EVERY 12 HOURS X 2 DOSES AT ONSET OF OUTBREAK OF COLD SORES 20 tablet 1   zolmitriptan (ZOMIG) 5 MG tablet TAKE 1 TAB AT ONSET OF MIGRAINE, MAY REPEAT DOSE IN 2 HOURS IF NEEDED. MAX IN 24H IS 10 MG. 8 tablet 6   NALTREXONE HCL PO Take 2 mg by mouth  daily.     NALTREXONE HCL PO Take 3 mg by mouth daily.     No facility-administered medications prior to visit.    No Known Allergies  Review of Systems As per HPI  PE:    10/16/2022    8:37 AM 08/01/2022    9:03 AM 06/19/2022    8:57 AM  Vitals with BMI  Height 5' 7.13"  5' 7.13"  Weight 185 lbs 10 oz 192 lbs 200 lbs  BMI 28.96  31.21  Systolic 106 120 478  Diastolic 72 83 74  Pulse 94 98 78     Physical Exam  Gen: Alert, well appearing.  Patient is oriented to person, place, time, and situation. AFFECT: pleasant, lucid thought and speech. No further exam today  LABS:  Last metabolic panel Lab Results  Component Value Date   GLUCOSE 68 (L) 06/19/2022   NA 141 06/19/2022   K 4.0 06/19/2022   CL 103 06/19/2022   CO2 29 06/19/2022   BUN 16 06/19/2022   CREATININE 0.90 06/19/2022   GFR 72.47 06/19/2022   CALCIUM 9.5 06/19/2022   PROT 6.8 06/19/2022   ALBUMIN 4.2 06/19/2022   BILITOT 0.3 06/19/2022   ALKPHOS 91 06/19/2022   AST 14 06/19/2022   ALT 13 06/19/2022    IMPRESSION AND PLAN:  #1 myofascial pain syndrome. Doing well on naltrexone 4 mg twice a day and Lyrica 150 mg twice a day. Refills not needed today.  2.  Adult ADD, doing well on Adderall 20 mg twice a day. Refill not needed today.  #3 trochanteric pain syndrome, bilateral. Right side with IT band friction syndrome at the level of the hip lately. Gave handout on good hip stretches.  #4 obesity, weight management. Her BMI is now down less than 30.  She is doing great on Saxenda at the 2.4 mg SQ weekly dosing. When she calls for refill soon we will do a 1 month supply with 11 refills--> this will satisfy the insurers way that it needs to be prescribed to be payed for with the least pushback.  An After Visit Summary was printed and given to the patient.  FOLLOW UP: Return in about 6 months (around 04/15/2023) for routine chronic illness f/u. Next cpe 06/2023  Signed:  Santiago Bumpers, MD            10/16/2022

## 2022-10-27 ENCOUNTER — Encounter: Payer: Self-pay | Admitting: Family Medicine

## 2022-10-27 MED ORDER — WEGOVY 2.4 MG/0.75ML ~~LOC~~ SOAJ: 2.4000 mg | SUBCUTANEOUS | 10 refills | Status: DC

## 2022-10-27 NOTE — Telephone Encounter (Signed)
Semaglutide-Weight Management (WEGOVY) 2.4 MG/0.75ML SOAJ Inject 2.4 mg into the skin once a week. Dispense: 3 mL, Refills: 1 ordered   08/25/2022    Please fill, if appropriate. Last OV 10/16/22. Rx pending

## 2022-10-27 NOTE — Telephone Encounter (Signed)
Heidi Leonard prescription sent

## 2022-11-26 ENCOUNTER — Other Ambulatory Visit: Payer: Self-pay | Admitting: Family Medicine

## 2022-12-05 MED ORDER — WEGOVY 2.4 MG/0.75ML ~~LOC~~ SOAJ: 2.4000 mg | SUBCUTANEOUS | 11 refills | Status: DC

## 2022-12-05 MED ORDER — WEGOVY 2.4 MG/0.75ML ~~LOC~~ SOAJ: 2.4000 mg | SUBCUTANEOUS | Status: DC

## 2022-12-05 NOTE — Telephone Encounter (Signed)
Please call [facility] pharmacy in [place] and prescribe naltrexone 4 mg, 1 tab twice a day, #60, refill x 5.

## 2023-03-28 ENCOUNTER — Other Ambulatory Visit: Payer: Self-pay | Admitting: Family Medicine

## 2023-03-30 MED ORDER — AMPHETAMINE-DEXTROAMPHETAMINE 20 MG PO TABS: 20.0000 mg | ORAL_TABLET | Freq: Two times a day (BID) | ORAL | 0 refills | Status: DC

## 2023-03-30 NOTE — Telephone Encounter (Signed)
 [facility]. Next OV not scheduled but not due until 04/15/23

## 2023-04-18 ENCOUNTER — Other Ambulatory Visit: Payer: Self-pay | Admitting: Family Medicine

## 2023-04-21 ENCOUNTER — Other Ambulatory Visit: Payer: Self-pay | Admitting: Family Medicine

## 2023-04-21 NOTE — Telephone Encounter (Signed)
 Last Fill: 09/29/22  Last OV: 10/16/22 Next OV: 05/04/23  Routing to provider for review/authorization.

## 2023-04-21 NOTE — Telephone Encounter (Signed)
 Copied from CRM [id number]. Topic: Clinical - Medication Refill >> Apr 21, 2023  1:10 PM Sim Boast F wrote: Most Recent Primary Care Visit:  Provider: Jeoffrey Massed  Department: [facility]  Visit Type: OFFICE VISIT  Date: 10/16/2022  Medication: pregabalin   Has the patient contacted their pharmacy? Yes (Agent: If no, request that the patient contact the pharmacy for the refill. If patient does not wish to contact the pharmacy document the reason why and proceed with request.) (Agent: If yes, when and what did the pharmacy advise?)  Is this the correct pharmacy for this prescription? Yes If no, delete pharmacy and type the correct one.  This is the patient's preferred pharmacy:   [id number] [place] [place] - [address] [place] [address] Phone: [phone] Fax: [phone]    Has the prescription been filled recently? Yes  Is the patient out of the medication? Yes, patient has been out for a couple of days   Has the patient been seen for an appointment in the last year OR does the patient have an upcoming appointment? Yes, next appt 05/04/23  Can we respond through MyChart? Yes  Agent: Please be advised that Rx refills may take up to 3 business days. We ask that you follow-up with your pharmacy.

## 2023-04-22 MED ORDER — PREGABALIN 150 MG PO CAPS: 150.0000 mg | ORAL_CAPSULE | Freq: Two times a day (BID) | ORAL | 1 refills | Status: DC

## 2023-04-22 NOTE — Telephone Encounter (Signed)
 Copied from CRM [id number]. Topic: Clinical - Prescription Issue >> Apr 22, 2023 12:56 PM Jon Gills C wrote: Reason for CRM: Patient called in regarding prescription pregabalin (LYRICA) 150 MG capsule, patient stated that she has been out of the medication for a couple days now and really needs the medication I did inform patient that she called it in yesterday and it is pending

## 2023-05-04 ENCOUNTER — Ambulatory Visit: Admitting: Family Medicine

## 2023-05-20 ENCOUNTER — Ambulatory Visit: Admitting: Family Medicine

## 2023-05-20 ENCOUNTER — Encounter: Payer: Self-pay | Admitting: Family Medicine

## 2023-05-20 VITALS — BP 122/87 | HR 86 | Ht 67.13 in | Wt 183.0 lb

## 2023-05-20 DIAGNOSIS — M797 Fibromyalgia: Secondary | ICD-10-CM

## 2023-05-20 DIAGNOSIS — F988 Other specified behavioral and emotional disorders with onset usually occurring in childhood and adolescence: Secondary | ICD-10-CM | POA: Diagnosis not present

## 2023-05-20 DIAGNOSIS — F411 Generalized anxiety disorder: Secondary | ICD-10-CM

## 2023-05-20 DIAGNOSIS — F331 Major depressive disorder, recurrent, moderate: Secondary | ICD-10-CM | POA: Diagnosis not present

## 2023-05-20 DIAGNOSIS — Z79899 Other long term (current) drug therapy: Secondary | ICD-10-CM

## 2023-05-20 DIAGNOSIS — Z7689 Persons encountering health services in other specified circumstances: Secondary | ICD-10-CM

## 2023-05-20 DIAGNOSIS — Z1211 Encounter for screening for malignant neoplasm of colon: Secondary | ICD-10-CM

## 2023-05-20 MED ORDER — TRAMADOL HCL 50 MG PO TABS: ORAL_TABLET | ORAL | 2 refills | Status: DC

## 2023-05-20 MED ORDER — DESVENLAFAXINE SUCCINATE ER 50 MG PO TB24: 50.0000 mg | ORAL_TABLET | Freq: Every day | ORAL | 0 refills | Status: DC

## 2023-05-20 MED ORDER — BUPROPION HCL ER (XL) 300 MG PO TB24: 300.0000 mg | ORAL_TABLET | Freq: Every day | ORAL | 3 refills | Status: DC

## 2023-05-20 MED ORDER — FLUOXETINE HCL 20 MG PO CAPS: 20.0000 mg | ORAL_CAPSULE | Freq: Every day | ORAL | 0 refills | Status: DC

## 2023-05-20 NOTE — Progress Notes (Signed)
 OFFICE VISIT  05/20/2023  CC:  Chief Complaint  Patient presents with   Medical Management of Chronic Issues    Pt would like to discuss medication concerns and if provider had any suggestions for hair thinning.    Patient is a 58 y.o. female who presents for 20-month follow-up fibromyalgia, adult ADD, and weight management. A/P as of last visit: "#1 myofascial pain syndrome. Doing well on naltrexone 4 mg twice a day and Lyrica 150 mg twice a day. Refills not needed today.   2.  Adult ADD, doing well on Adderall 20 mg twice a day. Refill not needed today.   #3 trochanteric pain syndrome, bilateral. Right side with IT band friction syndrome at the level of the hip lately. Gave handout on good hip stretches.   #4 obesity, weight management. Her BMI is now down less than 30.  She is doing great on Saxenda at the 2.4 mg SQ weekly dosing. When she calls for refill soon we will do a 1 month supply with 11 refills--> this will satisfy the insurers way that it needs to be prescribed to be payed for with the least pushback."  INTERIM HX: Feeling overwhelmed with many life responsibilities that she used to be able to juggle well. Has had decreased motivation, concentration, and energy.  She is withdrawing more, wants to sleep in more. Denies SI or HI.  Anxiety level high constantly.  Giving her trouble sleeping. She is stress eating. She does have some myofascial pain but does say that it is improved with taking Lyrica 150 mg twice a day.  She stopped the naltrexone because she felt like it stopped working.  She takes Pristiq 100 mg daily, Wellbutrin XL 300 mg daily, and Adderall 20 mg twice a day. She does still take Wegovy 2.4 mg weekly and has maintained her weight well.   PMP AWARE reviewed today: most recent rx for Lyrica was filled 04/22/2023, # 180, rx by me. Most recent prescription for Adderall was filled 03/30/2023, #180, prescription by me. No red flags.   Past Medical  History:  Diagnosis Date   Adult ADHD 06/2017   DDD (degenerative disc disease), cervical 07/2018   VERY mild C5-6, right   GAD (generalized anxiety disorder)    with panic attacks   History of abnormal Pap smear    Repeats are always normal per pt (has GYN MD)   History of depression    Lexapro helped for a while, Pristique started 04/2011   Intercostal muscle tear 11/2013   R side   Migraines    Myofascial pain 2020   upper back/neck   Subclinical hyperthyroidism 2010   Some low TSHs and normal FT4 and T3   Vitamin D deficiency 11/2011    Past Surgical History:  Procedure Laterality Date   Unremarkable.      Outpatient Medications Prior to Visit  Medication Sig Dispense Refill   amphetamine-dextroamphetamine (ADDERALL) 20 MG tablet Take 1 tablet (20 mg total) by mouth 2 (two) times daily. 180 tablet 0   Multiple Vitamins-Calcium (ONE-A-DAY WOMENS PO) Take by mouth. Take 1 daily.     pregabalin (LYRICA) 150 MG capsule Take 1 capsule (150 mg total) by mouth 2 (two) times daily. 180 capsule 1   Semaglutide-Weight Management (WEGOVY) 2.4 MG/0.75ML SOAJ Inject 2.4 mg into the skin once a week. 3 mL 11   valACYclovir (VALTREX) 1000 MG tablet TAKE 2 TABLETS BY MOUTH EVERY 12 HOURS X 2 DOSES AT ONSET OF OUTBREAK OF  COLD SORES 20 tablet 1   zolmitriptan (ZOMIG) 5 MG tablet TAKE 1 TAB AT ONSET OF MIGRAINE, MAY REPEAT DOSE IN 2 HOURS IF NEEDED. MAX IN 24H IS 10 MG. 8 tablet 6   desvenlafaxine (PRISTIQ) 100 MG 24 hr tablet TAKE 1 TABLET BY MOUTH EVERY DAY 90 tablet 1   NALTREXONE HCL PO Take 1 mg by mouth daily. 4 mg daily as of 10/2022     buPROPion (WELLBUTRIN XL) 300 MG 24 hr tablet TAKE 1 TABLET BY MOUTH EVERY DAY 90 tablet 1   No facility-administered medications prior to visit.    No Known Allergies  Review of Systems As per HPI  PE:    05/20/2023    3:36 PM 10/16/2022    8:37 AM 08/01/2022    9:03 AM  Vitals with BMI  Height 5' 7.13" 5' 7.13"   Weight 183 lbs 185 lbs 10 oz  192 lbs  BMI 28.55 28.96   Systolic 122 106 161  Diastolic 87 72 83  Pulse 86 94 98     Physical Exam  Gen: Alert, well appearing.  Patient is oriented to person, place, time, and situation. AFFECT: pleasant, lucid thought and speech. No further exam today  LABS:  Last CBC Lab Results  Component Value Date   WBC 5.8 06/19/2022   HGB 14.5 06/19/2022   HCT 41.8 06/19/2022   MCV 87.8 06/19/2022   MCH 29.2 07/16/2018   RDW 13.2 06/19/2022   PLT 253.0 06/19/2022   Last metabolic panel Lab Results  Component Value Date   GLUCOSE 68 (L) 06/19/2022   NA 141 06/19/2022   K 4.0 06/19/2022   CL 103 06/19/2022   CO2 29 06/19/2022   BUN 16 06/19/2022   CREATININE 0.90 06/19/2022   GFR 72.47 06/19/2022   CALCIUM 9.5 06/19/2022   PROT 6.8 06/19/2022   ALBUMIN 4.2 06/19/2022   BILITOT 0.3 06/19/2022   ALKPHOS 91 06/19/2022   AST 14 06/19/2022   ALT 13 06/19/2022   Last lipids Lab Results  Component Value Date   CHOL 203 (H) 06/19/2022   HDL 46.20 06/19/2022   LDLCALC 122 (H) 06/19/2022   LDLDIRECT 161.3 01/25/2013   TRIG 175.0 (H) 06/19/2022   CHOLHDL 4 06/19/2022   Last thyroid functions Lab Results  Component Value Date   TSH 1.04 06/19/2022   T3TOTAL 101 05/13/2017   Lab Results  Component Value Date   ESRSEDRATE 2 07/16/2018   IMPRESSION AND PLAN:  #1 recurrent major depressive disorder, uncontrolled generalized anxiety disorder. Wean off Pristiq--> decrease to 50 mg daily at this time. Start fluoxetine 20 mg a day. Continue Wellbutrin XL 300 mg a day for now.  #2 adult ADD. Doing well with Adderall 20 mg twice a day. Urine drug screen today.  Controlled substance contract updated.  3.  Fibromyalgia syndrome. She responds moderately well to Lyrica 150 mg twice a day. Restart tramadol 50 mg, 1-2 twice daily as needed, #30, refill x 2.  #4 weight management. She has done well and is stable on Wegovy 2.4 mg weekly.  #5 preventative health  care: Cologuard ordered today.  An After Visit Summary was printed and given to the patient.  FOLLOW UP: Return in about 2 weeks (around 06/03/2023) for routine chronic illness f/u.  Signed:  Santiago Bumpers, MD           05/20/2023

## 2023-05-22 ENCOUNTER — Encounter: Payer: Self-pay | Admitting: Family Medicine

## 2023-05-22 ENCOUNTER — Other Ambulatory Visit: Payer: Self-pay

## 2023-05-22 DIAGNOSIS — B009 Herpesviral infection, unspecified: Secondary | ICD-10-CM

## 2023-05-22 MED ORDER — VALACYCLOVIR HCL 1 G PO TABS: ORAL_TABLET | ORAL | 1 refills | Status: AC

## 2023-05-23 LAB — DRUG MONITORING PANEL 376104, URINE
Amphetamine: 5250 ng/mL — ABNORMAL HIGH (ref <250)
Amphetamines: POSITIVE ng/mL — AB (ref <500)
Barbiturates: NEGATIVE ng/mL (ref <300)
Benzodiazepines: NEGATIVE ng/mL (ref <100)
Cocaine Metabolite: NEGATIVE ng/mL (ref <150)
Desmethyltramadol: NEGATIVE ng/mL (ref <100)
Methamphetamine: NEGATIVE ng/mL (ref <250)
Opiates: NEGATIVE ng/mL (ref <100)
Oxycodone: NEGATIVE ng/mL (ref <100)
Tramadol: NEGATIVE ng/mL (ref <100)

## 2023-05-23 LAB — DM TEMPLATE

## 2023-05-27 ENCOUNTER — Encounter: Payer: Self-pay | Admitting: Family Medicine

## 2023-05-27 NOTE — Telephone Encounter (Signed)
 Yes, Adderall is an amphetamine. This is what your urine drug test should look like. Hope this helps. --PM

## 2023-05-30 DIAGNOSIS — Z1211 Encounter for screening for malignant neoplasm of colon: Secondary | ICD-10-CM | POA: Diagnosis not present

## 2023-06-04 LAB — COLOGUARD: COLOGUARD: NEGATIVE

## 2023-06-11 ENCOUNTER — Other Ambulatory Visit: Payer: Self-pay | Admitting: Family Medicine

## 2023-06-17 ENCOUNTER — Encounter: Payer: Self-pay | Admitting: Family Medicine

## 2023-06-17 ENCOUNTER — Ambulatory Visit: Admitting: Family Medicine

## 2023-06-17 ENCOUNTER — Other Ambulatory Visit: Payer: Self-pay | Admitting: Family Medicine

## 2023-06-17 VITALS — BP 106/71 | HR 105 | Ht 67.13 in | Wt 181.0 lb

## 2023-06-17 DIAGNOSIS — F3341 Major depressive disorder, recurrent, in partial remission: Secondary | ICD-10-CM

## 2023-06-17 DIAGNOSIS — F411 Generalized anxiety disorder: Secondary | ICD-10-CM | POA: Diagnosis not present

## 2023-06-17 DIAGNOSIS — L821 Other seborrheic keratosis: Secondary | ICD-10-CM | POA: Diagnosis not present

## 2023-06-17 DIAGNOSIS — L989 Disorder of the skin and subcutaneous tissue, unspecified: Secondary | ICD-10-CM | POA: Diagnosis not present

## 2023-06-17 MED ORDER — FLUOXETINE HCL 20 MG PO CAPS: 20.0000 mg | ORAL_CAPSULE | Freq: Every day | ORAL | 1 refills | Status: DC

## 2023-06-17 MED ORDER — ALPRAZOLAM 0.5 MG PO TABS: ORAL_TABLET | ORAL | 1 refills | Status: DC

## 2023-06-17 MED ORDER — BUPROPION HCL ER (XL) 150 MG PO TB24: 150.0000 mg | ORAL_TABLET | Freq: Every day | ORAL | 0 refills | Status: DC

## 2023-06-17 NOTE — Progress Notes (Signed)
 OFFICE VISIT  06/17/2023  CC:  Chief Complaint  Patient presents with   Medical Management of Chronic Issues   Patient is a 58 y.o. female who presents for 1 month follow-up depression and anxiety. A/P as of last visit: "#1 recurrent major depressive disorder, uncontrolled generalized anxiety disorder. Wean off Pristiq --> decrease to 50 mg daily at this time. Start fluoxetine  20 mg a day. Continue Wellbutrin  XL 300 mg a day for now.   #2 adult ADD. Doing well with Adderall 20 mg twice a day. Urine drug screen today.  Controlled substance contract updated.   3.  Fibromyalgia syndrome. She responds moderately well to Lyrica  150 mg twice a day. Restart tramadol  50 mg, 1-2 twice daily as needed, #30, refill x 2.   #4 weight management. She has done well and is stable on Wegovy  2.4 mg weekly.   #5 preventative health care: Cologuard ordered today."  INTERIM HX: Labs all good last visit.  Mood is significantly improved. Anxiety level a little better although she says since her medication changes a month ago she does feel an internal kind of tension and externally feels like she is tensed up all the time.  She is not tremulous.  No panic attacks. No visual abnormalities or headaches or dizziness.  No hyperhidrosis.  No urinary difficulties. She feels like she is sleeping well now.  She is eating and drinking well.    Past Medical History:  Diagnosis Date   Adult ADHD 06/2017   DDD (degenerative disc disease), cervical 07/2018   VERY mild C5-6, right   GAD (generalized anxiety disorder)    with panic attacks   History of abnormal Pap smear    Repeats are always normal per pt (has GYN MD)   History of depression    Lexapro helped for a while, Pristique started 04/2011   Intercostal muscle tear 11/2013   R side   Migraines    Myofascial pain 2020   upper back/neck   Subclinical hyperthyroidism 2010   Some low TSHs and normal FT4 and T3   Vitamin D deficiency 11/2011     Past Surgical History:  Procedure Laterality Date   Unremarkable.      Outpatient Medications Prior to Visit  Medication Sig Dispense Refill   amphetamine -dextroamphetamine  (ADDERALL) 20 MG tablet Take 1 tablet (20 mg total) by mouth 2 (two) times daily. 180 tablet 0   Multiple Vitamins-Calcium (ONE-A-DAY WOMENS PO) Take by mouth. Take 1 daily.     pregabalin  (LYRICA ) 150 MG capsule Take 1 capsule (150 mg total) by mouth 2 (two) times daily. 180 capsule 1   Semaglutide -Weight Management (WEGOVY ) 2.4 MG/0.75ML SOAJ Inject 2.4 mg into the skin once a week. 3 mL 11   traMADol  (ULTRAM ) 50 MG tablet 1-2 tabs po bid prn pain 30 tablet 2   buPROPion  (WELLBUTRIN  XL) 300 MG 24 hr tablet Take 1 tablet (300 mg total) by mouth daily. 90 tablet 3   desvenlafaxine  (PRISTIQ ) 50 MG 24 hr tablet Take 1 tablet (50 mg total) by mouth daily. 30 tablet 0   FLUoxetine  (PROZAC ) 20 MG capsule Take 1 capsule (20 mg total) by mouth daily. 30 capsule 0   valACYclovir  (VALTREX ) 1000 MG tablet TAKE 2 TABLETS BY MOUTH EVERY 12 HOURS X 2 DOSES AT ONSET OF OUTBREAK OF COLD SORES (Patient not taking: Reported on 06/17/2023) 20 tablet 1   zolmitriptan  (ZOMIG ) 5 MG tablet TAKE 1 TAB AT ONSET OF MIGRAINE, MAY REPEAT DOSE IN 2 HOURS  IF NEEDED. MAX IN 24H IS 10 MG. (Patient not taking: Reported on 06/17/2023) 8 tablet 6   No facility-administered medications prior to visit.    No Known Allergies  Review of Systems As per HPI  PE:    06/17/2023    3:51 PM 05/20/2023    3:36 PM 10/16/2022    8:37 AM  Vitals with BMI  Height 5' 7.13" 5' 7.13" 5' 7.13"  Weight 181 lbs 183 lbs 185 lbs 10 oz  BMI 28.24 28.55 28.96  Systolic 106 122 409  Diastolic 71 87 72  Pulse 105 86 94     Physical Exam  Gen: Alert, well appearing.  Patient is oriented to person, place, time, and situation. AFFECT: pleasant, lucid thought and speech. No further exam today  LABS:  Last CBC Lab Results  Component Value Date   WBC 5.8 06/19/2022    HGB 14.5 06/19/2022   HCT 41.8 06/19/2022   MCV 87.8 06/19/2022   MCH 29.2 07/16/2018   RDW 13.2 06/19/2022   PLT 253.0 06/19/2022   Last metabolic panel Lab Results  Component Value Date   GLUCOSE 68 (L) 06/19/2022   NA 141 06/19/2022   K 4.0 06/19/2022   CL 103 06/19/2022   CO2 29 06/19/2022   BUN 16 06/19/2022   CREATININE 0.90 06/19/2022   GFR 72.47 06/19/2022   CALCIUM 9.5 06/19/2022   PROT 6.8 06/19/2022   ALBUMIN 4.2 06/19/2022   BILITOT 0.3 06/19/2022   ALKPHOS 91 06/19/2022   AST 14 06/19/2022   ALT 13 06/19/2022   Last lipids Lab Results  Component Value Date   CHOL 203 (H) 06/19/2022   HDL 46.20 06/19/2022   LDLCALC 122 (H) 06/19/2022   LDLDIRECT 161.3 01/25/2013   TRIG 175.0 (H) 06/19/2022   CHOLHDL 4 06/19/2022   IMPRESSION AND PLAN:  Recurrent major depressive disorder and GAD. Mood is improved, anxiety levels stable.  She does have some mild serotonergic symptoms but not to the point of serotonin syndrome. Will decrease her Wellbutrin  XL to 150 mg a day and go ahead and discontinue her Pristiq  completely.  Continue Prozac  20 mg a day. Xanax  0.5 mg, 1-2 twice daily as needed, #20, refill x 1.  Therapeutic expectations and side effect profile of medication discussed today.  Patient's questions answered.  An After Visit Summary was printed and given to the patient.  FOLLOW UP: Return in about 4 weeks (around 07/15/2023) for Follow-up anxiety and depression.  Signed:  Arletha Lady, MD           06/17/2023

## 2023-06-17 NOTE — Telephone Encounter (Signed)
 Pt has an OV today at 4

## 2023-06-17 NOTE — Telephone Encounter (Signed)
 Pt will no longer be taking this medication per provider

## 2023-07-10 ENCOUNTER — Other Ambulatory Visit: Payer: Self-pay | Admitting: Family Medicine

## 2023-07-14 ENCOUNTER — Other Ambulatory Visit: Payer: Self-pay | Admitting: Family Medicine

## 2023-07-23 ENCOUNTER — Encounter: Payer: Self-pay | Admitting: Family Medicine

## 2023-07-23 ENCOUNTER — Ambulatory Visit: Admitting: Family Medicine

## 2023-07-23 VITALS — BP 101/66 | HR 84 | Temp 98.6°F | Ht 67.13 in | Wt 178.8 lb

## 2023-07-23 DIAGNOSIS — F411 Generalized anxiety disorder: Secondary | ICD-10-CM

## 2023-07-23 DIAGNOSIS — F3341 Major depressive disorder, recurrent, in partial remission: Secondary | ICD-10-CM

## 2023-07-23 MED ORDER — ZOLMITRIPTAN 5 MG PO TABS: ORAL_TABLET | ORAL | 6 refills | Status: DC

## 2023-07-23 NOTE — Progress Notes (Signed)
 OFFICE VISIT  07/23/2023  CC:  Chief Complaint  Patient presents with   Medical Management of Chronic Issues    Patient is a 58 y.o. female who presents for 5-week follow-up depression and anxiety. A/P as of last visit: Recurrent major depressive disorder and GAD. Mood is improved, anxiety levels stable.  She does have some mild serotonergic symptoms but not to the point of serotonin syndrome. Will decrease her Wellbutrin  XL to 150 mg a day and go ahead and discontinue her Pristiq  completely.  Continue Prozac  20 mg a day. Xanax  0.5 mg, 1-2 twice daily as needed, #20, refill x 1.  Therapeutic expectations and side effect profile of medication discussed today.  Patient's questions answered.  INTERIM HX: Caylen is doing much better. Anxiety and depression level well-managed currently.  She continues to gradually wean off the Wellbutrin .  She has used Xanax  a few times and it has been helpful. She still feels a mild to moderate amount of general tenseness in the muscles, she realizes she feels it more when nothing is going on such as in the evenings.  No tremor or myoclonus.  Past Medical History:  Diagnosis Date   Adult ADHD 06/2017   DDD (degenerative disc disease), cervical 07/2018   VERY mild C5-6, right   GAD (generalized anxiety disorder)    with panic attacks   History of abnormal Pap smear    Repeats are always normal per pt (has GYN MD)   History of depression    Lexapro helped for a while, Pristique started 04/2011   Intercostal muscle tear 11/2013   R side   Migraines    Myofascial pain 2020   upper back/neck   Subclinical hyperthyroidism 2010   Some low TSHs and normal FT4 and T3   Vitamin D deficiency 11/2011    Past Surgical History:  Procedure Laterality Date   Unremarkable.      Outpatient Medications Prior to Visit  Medication Sig Dispense Refill   ALPRAZolam  (XANAX ) 0.5 MG tablet 1-2 tabs p.o bid as needed for anxiety 20 tablet 1    amphetamine -dextroamphetamine  (ADDERALL) 20 MG tablet Take 1 tablet (20 mg total) by mouth 2 (two) times daily. 180 tablet 0   Multiple Vitamins-Calcium (ONE-A-DAY WOMENS PO) Take by mouth. Take 1 daily.     pregabalin  (LYRICA ) 150 MG capsule Take 1 capsule (150 mg total) by mouth 2 (two) times daily. 180 capsule 1   Semaglutide -Weight Management (WEGOVY ) 2.4 MG/0.75ML SOAJ Inject 2.4 mg into the skin once a week. 3 mL 11   traMADol  (ULTRAM ) 50 MG tablet 1-2 tabs po bid prn pain 30 tablet 2   buPROPion  (WELLBUTRIN  XL) 150 MG 24 hr tablet Take 1 tablet (150 mg total) by mouth daily. 30 tablet 0   zolmitriptan  (ZOMIG ) 5 MG tablet TAKE 1 TAB AT ONSET OF MIGRAINE, MAY REPEAT DOSE IN 2 HOURS IF NEEDED. MAX IN 24H IS 10 MG. 8 tablet 6   FLUoxetine  (PROZAC ) 20 MG capsule Take 1 capsule (20 mg total) by mouth daily. (Patient not taking: Reported on 07/23/2023) 90 capsule 1   valACYclovir  (VALTREX ) 1000 MG tablet TAKE 2 TABLETS BY MOUTH EVERY 12 HOURS X 2 DOSES AT ONSET OF OUTBREAK OF COLD SORES (Patient not taking: Reported on 07/23/2023) 20 tablet 1   No facility-administered medications prior to visit.    No Known Allergies  Review of Systems As per HPI  PE:    07/23/2023    8:32 AM 06/17/2023  3:51 PM 05/20/2023    3:36 PM  Vitals with BMI  Height 5' 7.13 5' 7.13 5' 7.13  Weight 178 lbs 13 oz 181 lbs 183 lbs  BMI 27.9 28.24 28.55  Systolic 101 106 865  Diastolic 66 71 87  Pulse 84 105 86     Physical Exam  Gen: Alert, well appearing.  Patient is oriented to person, place, time, and situation. AFFECT: pleasant, lucid thought and speech. No further exam today  LABS:  Last CBC Lab Results  Component Value Date   WBC 5.8 06/19/2022   HGB 14.5 06/19/2022   HCT 41.8 06/19/2022   MCV 87.8 06/19/2022   MCH 29.2 07/16/2018   RDW 13.2 06/19/2022   PLT 253.0 06/19/2022   Last metabolic panel Lab Results  Component Value Date   GLUCOSE 68 (L) 06/19/2022   NA 141 06/19/2022   K  4.0 06/19/2022   CL 103 06/19/2022   CO2 29 06/19/2022   BUN 16 06/19/2022   CREATININE 0.90 06/19/2022   GFR 72.47 06/19/2022   CALCIUM 9.5 06/19/2022   PROT 6.8 06/19/2022   ALBUMIN 4.2 06/19/2022   BILITOT 0.3 06/19/2022   ALKPHOS 91 06/19/2022   AST 14 06/19/2022   ALT 13 06/19/2022   Last lipids Lab Results  Component Value Date   CHOL 203 (H) 06/19/2022   HDL 46.20 06/19/2022   LDLCALC 122 (H) 06/19/2022   LDLDIRECT 161.3 01/25/2013   TRIG 175.0 (H) 06/19/2022   CHOLHDL 4 06/19/2022   Last thyroid  functions Lab Results  Component Value Date   TSH 1.04 06/19/2022   T3TOTAL 101 05/13/2017   IMPRESSION AND PLAN:  #1 recurrent major depressive disorder and GAD. Doing well. Continue wean off of Wellbutrin .  She is currently on a schedule of every other day dosing. She will continue fluoxetine  20 mg a day and Xanax  0.5 mg, 1-2 twice daily as needed.  She will continue on Adderall 20 mg twice daily, Lyrica  150 mg twice daily, and tramadol  50 mg 1-2 twice daily -->as needed as per her usual.  An After Visit Summary was printed and given to the patient.  FOLLOW UP: Return in about 3 months (around 10/23/2023) for annual CPE (fasting).  Signed:  Arletha Lady, MD           07/23/2023

## 2023-08-25 ENCOUNTER — Other Ambulatory Visit: Payer: Self-pay | Admitting: Family Medicine

## 2023-08-25 NOTE — Telephone Encounter (Signed)
 Requesting: tramadol  Contract: 05/20/23 UDS: 05/20/23 Last Visit: 07/23/23 Next Visit: 10/22/23 Last Refill: 05/20/23 (30,2)  Please Advise. Rx pending

## 2023-09-07 ENCOUNTER — Other Ambulatory Visit (HOSPITAL_COMMUNITY): Payer: Self-pay

## 2023-09-07 ENCOUNTER — Telehealth: Payer: Self-pay | Admitting: Pharmacy Technician

## 2023-09-07 NOTE — Telephone Encounter (Signed)
 Pharmacy Patient Advocate Encounter   Received notification from CoverMyMeds that prior authorization for Wegovy  2.4MG /0.75ML auto-injectors is required/requested.   Insurance verification completed.   The patient is insured through [facility] .   Per test claim: PA required; PA submitted to above mentioned insurance via CoverMyMeds Key/confirmation #/EOC [id number] Status is pending

## 2023-09-08 ENCOUNTER — Other Ambulatory Visit (HOSPITAL_COMMUNITY): Payer: Self-pay

## 2023-09-08 NOTE — Telephone Encounter (Signed)
 Pharmacy Patient Advocate Encounter  Received notification from [facility] that Prior Authorization for Wegovy  2.4MG /0.75ML auto-injectors has been APPROVED from 09/07/23 to 09/06/24   PA #/Case ID/Reference #: [id number]

## 2023-09-10 ENCOUNTER — Encounter: Payer: Self-pay | Admitting: Family Medicine

## 2023-09-21 ENCOUNTER — Other Ambulatory Visit: Payer: Self-pay | Admitting: Family Medicine

## 2023-09-21 ENCOUNTER — Encounter: Payer: Self-pay | Admitting: Family Medicine

## 2023-09-22 MED ORDER — AMPHETAMINE-DEXTROAMPHETAMINE 20 MG PO TABS: 20.0000 mg | ORAL_TABLET | Freq: Two times a day (BID) | ORAL | 0 refills | Status: AC

## 2023-09-22 NOTE — Telephone Encounter (Signed)
 done

## 2023-09-30 ENCOUNTER — Other Ambulatory Visit: Payer: Self-pay | Admitting: Family Medicine

## 2023-09-30 DIAGNOSIS — Z1231 Encounter for screening mammogram for malignant neoplasm of breast: Secondary | ICD-10-CM

## 2023-10-02 ENCOUNTER — Ambulatory Visit
Admission: RE | Admit: 2023-10-02 | Discharge: 2023-10-02 | Disposition: A | Discharge status: Home / Self Care | Source: Ambulatory Visit | Attending: Family Medicine | Admitting: Family Medicine

## 2023-10-02 DIAGNOSIS — Z1231 Encounter for screening mammogram for malignant neoplasm of breast: Secondary | ICD-10-CM | POA: Diagnosis not present

## 2023-10-06 ENCOUNTER — Ambulatory Visit: Admitting: Family Medicine

## 2023-10-06 ENCOUNTER — Encounter: Payer: Self-pay | Admitting: Family Medicine

## 2023-10-06 ENCOUNTER — Ambulatory Visit: Payer: Self-pay | Admitting: Family Medicine

## 2023-10-06 VITALS — BP 105/72 | HR 87 | Temp 98.3°F | Ht 67.13 in | Wt 178.0 lb

## 2023-10-06 DIAGNOSIS — F3342 Major depressive disorder, recurrent, in full remission: Secondary | ICD-10-CM

## 2023-10-06 DIAGNOSIS — Z13220 Encounter for screening for lipoid disorders: Secondary | ICD-10-CM | POA: Diagnosis not present

## 2023-10-06 DIAGNOSIS — F988 Other specified behavioral and emotional disorders with onset usually occurring in childhood and adolescence: Secondary | ICD-10-CM | POA: Diagnosis not present

## 2023-10-06 DIAGNOSIS — Z0000 Encounter for general adult medical examination without abnormal findings: Secondary | ICD-10-CM

## 2023-10-06 DIAGNOSIS — M797 Fibromyalgia: Secondary | ICD-10-CM

## 2023-10-06 DIAGNOSIS — F411 Generalized anxiety disorder: Secondary | ICD-10-CM

## 2023-10-06 DIAGNOSIS — Z79899 Other long term (current) drug therapy: Secondary | ICD-10-CM

## 2023-10-06 DIAGNOSIS — Z7689 Persons encountering health services in other specified circumstances: Secondary | ICD-10-CM

## 2023-10-06 LAB — CBC WITH DIFFERENTIAL/PLATELET
Basophils Absolute: 0 K/uL (ref 0.0–0.1)
Basophils Relative: 1 % (ref 0.0–3.0)
Eosinophils Absolute: 0.1 K/uL (ref 0.0–0.7)
Eosinophils Relative: 1.8 % (ref 0.0–5.0)
HCT: 40.3 % (ref 36.0–46.0)
Hemoglobin: 13.8 g/dL (ref 12.0–15.0)
Lymphocytes Relative: 41.4 % (ref 12.0–46.0)
Lymphs Abs: 1.8 K/uL (ref 0.7–4.0)
MCHC: 34.3 g/dL (ref 30.0–36.0)
MCV: 86.2 fl (ref 78.0–100.0)
Monocytes Absolute: 0.4 K/uL (ref 0.1–1.0)
Monocytes Relative: 9.3 % (ref 3.0–12.0)
Neutro Abs: 2 K/uL (ref 1.4–7.7)
Neutrophils Relative %: 46.5 % (ref 43.0–77.0)
Platelets: 225 K/uL (ref 150.0–400.0)
RBC: 4.67 Mil/uL (ref 3.87–5.11)
RDW: 12.7 % (ref 11.5–15.5)
WBC: 4.4 K/uL (ref 4.0–10.5)

## 2023-10-06 LAB — LIPID PANEL
Cholesterol: 218 mg/dL — ABNORMAL HIGH (ref 0–200)
HDL: 51.5 mg/dL (ref >39.00)
LDL Cholesterol: 149 mg/dL — ABNORMAL HIGH (ref 0–99)
NonHDL: 166.91
Total CHOL/HDL Ratio: 4
Triglycerides: 89 mg/dL (ref 0.0–149.0)
VLDL: 17.8 mg/dL (ref 0.0–40.0)

## 2023-10-06 LAB — COMPREHENSIVE METABOLIC PANEL WITH GFR
ALT: 38 U/L — ABNORMAL HIGH (ref 0–35)
AST: 19 U/L (ref 0–37)
Albumin: 4 g/dL (ref 3.5–5.2)
Alkaline Phosphatase: 71 U/L (ref 39–117)
BUN: 13 mg/dL (ref 6–23)
CO2: 30 meq/L (ref 19–32)
Calcium: 9.1 mg/dL (ref 8.4–10.5)
Chloride: 102 meq/L (ref 96–112)
Creatinine, Ser: 0.7 mg/dL (ref 0.40–1.20)
GFR: 97.09 mL/min (ref >60.00)
Glucose, Bld: 88 mg/dL (ref 70–99)
Potassium: 4.5 meq/L (ref 3.5–5.1)
Sodium: 140 meq/L (ref 135–145)
Total Bilirubin: 0.9 mg/dL (ref 0.2–1.2)
Total Protein: 6.4 g/dL (ref 6.0–8.3)

## 2023-10-06 LAB — TSH: TSH: 0.44 u[IU]/mL (ref 0.35–5.50)

## 2023-10-06 NOTE — Patient Instructions (Signed)

## 2023-10-06 NOTE — Progress Notes (Signed)
 OFFICE VISIT  10/06/2023  CC:  Chief Complaint  Patient presents with   Medical Management of Chronic Issues    Patient is a 58 y.o. female who presents for annual health maintenance exam and for 14-month follow-up anxiety, depression, and ADD. A/P as of last visit: #1 recurrent major depressive disorder and GAD. Doing well. Continue wean off of Wellbutrin .  She is currently on a schedule of every other day dosing. She will continue fluoxetine  20 mg a day and Xanax  0.5 mg, 1-2 twice daily as needed.   She will continue on Adderall 20 mg twice daily, Lyrica  150 mg twice daily, and tramadol  50 mg 1-2 twice daily -->as needed as per her usual.  INTERIM HX: Still with chronic anxiety that makes her tense all day.  Her myofascial pain in the shoulders, upper back, and neck have been more of a problem lately.  She takes tramadol  1-2 tabs per day. The thing that seems to help her the most with her anxiety is taking a 0.5 mg alprazolam .  She typically just does this at night but lately has been doing 1 in the morning as well.  She inquires about whether she can take this twice a day with regularity.  She has some depressed mood at times but nothing prolonged.  Denies hopelessness, anhedonia, or isolation. Sleep dysfunction is not uncommon for her, particularly when she is hurting more or has had a lot of stress in the day.  Pt states all is going well with the med at current dosing: much improved focus, concentration, task completion.  Less frustration, better multitasking, less impulsivity and restlessness.  Mood is stable. No side effects from the medication.  She does exercise by walking her dog regularly. She looks forward to her sister moving here soon and she will have a workout partner.   PMP AWARE reviewed today: most recent rx for tramadol  was filled 10/01/2023, # 30, rx by me.  Most recent Adderall 20 mg prescription was filled 09/22/2023, #180, prescription by me.  Most recent  alprazolam  prescription was filled 09/21/2023, #20, prescription by me. Most recent Lyrica  prescription was filled 07/24/2023, #8 180, prescription by me. No red flags.  Past Medical History:  Diagnosis Date   Adult ADHD 06/2017   DDD (degenerative disc disease), cervical 07/2018   VERY mild C5-6, right   GAD (generalized anxiety disorder)    with panic attacks   History of abnormal Pap smear    Repeats are always normal per pt (has GYN MD)   History of depression    Lexapro helped for a while, Pristique started 04/2011   Intercostal muscle tear 11/2013   R side   Migraines    Myofascial pain 2020   upper back/neck   Subclinical hyperthyroidism 2010   Some low TSHs and normal FT4 and T3   Vitamin D deficiency 11/2011    Past Surgical History:  Procedure Laterality Date   Unremarkable.     Social History   Socioeconomic History   Marital status: Married    Spouse name: Not on file   Number of children: Not on file   Years of education: Not on file   Highest education level: Not on file  Occupational History   Not on file  Tobacco Use   Smoking status: Former   Smokeless tobacco: Never   Tobacco comments:    Quit approx [5] years ago.  Substance and Sexual Activity   Alcohol use: Yes   Drug use: No  Sexual activity: Yes  Other Topics Concern   Not on file  Social History Narrative   Married, 3 step children.   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     Originally from [place].   Former cig smoker, quit 2009.  Occ alcohol.  No drugs.   Works out with [employer] and walks several days a week.      Social Drivers of Corporate investment banker Strain: Not on file  Food Insecurity: Not on file  Transportation Needs: Not on file  Physical Activity: Not on file  Stress: Not on file  Social Connections: Not on file   Family History  Problem Relation Age of Onset   Cancer Maternal Aunt    Healthy Mother    Other Father        Unknown  Health status.   Hypertension Sister     Outpatient Medications Prior to Visit  Medication Sig Dispense Refill   ALPRAZolam  (XANAX ) 0.5 MG tablet 1-2 tabs p.o bid as needed for anxiety 20 tablet 1   amphetamine -dextroamphetamine  (ADDERALL) 20 MG tablet Take 1 tablet (20 mg total) by mouth 2 (two) times daily. 180 tablet 0   FLUoxetine  (PROZAC ) 20 MG capsule Take 1 capsule (20 mg total) by mouth daily. 90 capsule 1   Multiple Vitamins-Calcium (ONE-A-DAY WOMENS PO) Take by mouth. Take 1 daily.     pregabalin  (LYRICA ) 150 MG capsule TAKE 1 CAPSULE BY MOUTH TWICE A DAY 180 capsule 1   Semaglutide -Weight Management (WEGOVY ) 2.4 MG/0.75ML SOAJ Inject 2.4 mg into the skin once a week. 3 mL 11   traMADol  (ULTRAM ) 50 MG tablet 1-2 TABLETS BY MOUTH TWICE DAILY AS NEEDED PAIN 30 tablet 2   zolmitriptan  (ZOMIG ) 5 MG tablet TAKE 1 TAB AT ONSET OF MIGRAINE, MAY REPEAT DOSE IN 2 HOURS IF NEEDED. MAX IN 24H IS 10 MG. 8 tablet 6   valACYclovir  (VALTREX ) 1000 MG tablet TAKE 2 TABLETS BY MOUTH EVERY 12 HOURS X 2 DOSES AT ONSET OF OUTBREAK OF COLD SORES (Patient not taking: Reported on 10/06/2023) 20 tablet 1   No facility-administered medications prior to visit.    No Known Allergies  Review of Systems  Constitutional:  Negative for appetite change, chills, fatigue and fever.  HENT:  Negative for congestion, dental problem, ear pain and sore throat.   Eyes:  Negative for discharge, redness and visual disturbance.  Respiratory:  Negative for cough, chest tightness, shortness of breath and wheezing.   Cardiovascular:  Negative for chest pain, palpitations and leg swelling.  Gastrointestinal:  Negative for abdominal pain, blood in stool, diarrhea, nausea and vomiting.  Genitourinary:  Negative for difficulty urinating, dysuria, flank pain, frequency, hematuria and urgency.  Musculoskeletal:  Positive for myalgias and neck stiffness. Negative for arthralgias, back pain and joint swelling.  Skin:  Negative for  pallor and rash.  Neurological:  Negative for dizziness, speech difficulty, weakness and headaches.  Hematological:  Negative for adenopathy. Does not bruise/bleed easily.  Psychiatric/Behavioral:  Positive for sleep disturbance. Negative for confusion. The patient is nervous/anxious.      PE:    10/06/2023    9:56 AM 07/23/2023    8:32 AM 06/17/2023    3:51 PM  Vitals with BMI  Height 5' 7.13 5' 7.13 5' 7.13  Weight 178 lbs 178 lbs 13 oz 181 lbs  BMI 27.77 27.9 28.24  Systolic 105 101 893  Diastolic 72 66 71  Pulse 87 84 105  Exam chaperoned by Bobbetta Degree, CMA.  Physical Exam  Gen: Alert, well appearing.  Patient is oriented to person, place, time, and situation. AFFECT: pleasant, lucid thought and speech. ENT: Ears: EACs clear, normal epithelium.  TMs with good light reflex and landmarks bilaterally.  Eyes: no injection, icteris, swelling, or exudate.  EOMI, PERRLA. Nose: no drainage or turbinate edema/swelling.  No injection or focal lesion.  Mouth: lips without lesion/swelling.  Oral mucosa pink and moist.  Dentition intact and without obvious caries or gingival swelling.  Oropharynx without erythema, exudate, or swelling.  Neck: supple/nontender.  No LAD, mass, or TM.  Carotid pulses 2+ bilaterally, without bruits. CV: RRR, no m/r/g.   LUNGS: CTA bilat, nonlabored resps, good aeration in all lung fields. ABD: soft, NT, ND, BS normal.  No hepatospenomegaly or mass.  No bruits. EXT: no clubbing, cyanosis, or edema.  Musculoskeletal: no joint swelling, erythema, warmth, or tenderness.  ROM of all joints intact. Skin - no sores or suspicious lesions or rashes or color changes  LABS:  Last CBC Lab Results  Component Value Date   WBC 5.8 06/19/2022   HGB 14.5 06/19/2022   HCT 41.8 06/19/2022   MCV 87.8 06/19/2022   MCH 29.2 07/16/2018   RDW 13.2 06/19/2022   PLT 253.0 06/19/2022   Last metabolic panel Lab Results  Component Value Date   GLUCOSE 68 (L)  06/19/2022   NA 141 06/19/2022   K 4.0 06/19/2022   CL 103 06/19/2022   CO2 29 06/19/2022   BUN 16 06/19/2022   CREATININE 0.90 06/19/2022   GFR 72.47 06/19/2022   CALCIUM 9.5 06/19/2022   PROT 6.8 06/19/2022   ALBUMIN 4.2 06/19/2022   BILITOT 0.3 06/19/2022   ALKPHOS 91 06/19/2022   AST 14 06/19/2022   ALT 13 06/19/2022   Lab Results  Component Value Date   WBC 5.8 06/19/2022   HGB 14.5 06/19/2022   HCT 41.8 06/19/2022   MCV 87.8 06/19/2022   PLT 253.0 06/19/2022   Lab Results  Component Value Date   TSH 1.04 06/19/2022   IMPRESSION AND PLAN:  No problem-specific Assessment & Plan notes found for this encounter.  1 health maintenance exam: Reviewed age and gender appropriate health maintenance issues (prudent diet, regular exercise, health risks of tobacco and excessive alcohol, use of seatbelts, fire alarms in home, use of sunscreen).  Also reviewed age and gender appropriate health screening as well as vaccine recommendations. Vaccines: Prevnar 20 --> declined.  Otherwise all UTD. Labs: HP ordered Cervical ca screening: per GYN--- needs to make appointment.  She will call if new referral is needed. Breast ca screening: most recent mammogram was 10/02/2023--> results pending. Colon ca screening: Cologuard NEG 05/30/2023.  Plan repeat 3 years.  #2 recurrent major depressive disorder, full remission. Continue fluoxetine  (see #3 below).  3.  GAD, not well-controlled. She will start taking 2 of her 20 mg fluoxetine  capsules per day.  When she is finished with these she will call for the 40 mg capsule prescription. Additionally, it is okay for her to take a 0.5 mg alprazolam  in the morning as well as in the afternoon/evening regularly.  This seems to help her more than anything. I have been prescribing 20 of these pills at a time in the past.  When she calls for refill in the future we will go ahead and put #60 as the number to dispense for 1 month supply.  4.   Fibromyalgia. Lyrica  150 mg twice daily and  as needed tramadol  does seem to help her but she still deals with a lot of pain chronically.  She used to get massage therapy which helped a lot but her massage therapist moved away. She will research what integrative therapy in [place] has to offer and she will get back to me if she needs a referral.  #5 adult ADD. Doing well on Adderall 20 mg twice daily.  #6 weight management, overweight. Continue Wegovy  2.4 mg weekly.  An After Visit Summary was printed and given to the patient.  FOLLOW UP: Return in about 6 months (around 04/07/2024).  Signed:  Gerlene Hockey, MD           10/06/2023

## 2023-10-16 ENCOUNTER — Ambulatory Visit: Admitting: Family Medicine

## 2023-10-18 ENCOUNTER — Encounter: Payer: Self-pay | Admitting: Family Medicine

## 2023-10-19 MED ORDER — ALPRAZOLAM 0.5 MG PO TABS: ORAL_TABLET | ORAL | 3 refills | Status: AC

## 2023-10-19 NOTE — Telephone Encounter (Signed)
 Patient was last seen 10/06/23, dicussed in OV note When she calls for refill in the future we will go ahead and put #60 as the number to dispense for 1 month supply . Next follow up should be scheduled for 6 months   Please review and advise

## 2023-10-22 ENCOUNTER — Ambulatory Visit (INDEPENDENT_AMBULATORY_CARE_PROVIDER_SITE_OTHER): Admitting: Dermatology

## 2023-10-22 ENCOUNTER — Encounter: Payer: Self-pay | Admitting: Dermatology

## 2023-10-22 VITALS — BP 103/70 | HR 92

## 2023-10-22 DIAGNOSIS — L821 Other seborrheic keratosis: Secondary | ICD-10-CM

## 2023-10-22 DIAGNOSIS — L649 Androgenic alopecia, unspecified: Secondary | ICD-10-CM | POA: Diagnosis not present

## 2023-10-22 DIAGNOSIS — L658 Other specified nonscarring hair loss: Secondary | ICD-10-CM

## 2023-10-22 DIAGNOSIS — L918 Other hypertrophic disorders of the skin: Secondary | ICD-10-CM | POA: Diagnosis not present

## 2023-10-22 NOTE — Progress Notes (Unsigned)
   New Patient Visit   Subjective  Heidi Leonard is a 58 y.o. female who presents for the following: Spot of concern. Lesion on left temple, present for several months. Not bothering her other than just wanting to know diagnosis.   She also mentions hair thinning over the last few years. Has previously tried topical minoxidil but it gave her red bumps on her scalp and forehead.   No hx of skin cancer or family hx.  The following portions of the chart were reviewed this encounter and updated as appropriate: medications, allergies, medical history  Review of Systems:  No other skin or systemic complaints except as noted in HPI or Assessment and Plan.  Objective  Well appearing patient in no apparent distress; mood and affect are within normal limits.  A focused examination was performed of the following areas: Face and underarms   Relevant exam findings are noted in the Assessment and Plan.    Assessment & Plan   SEBORRHEIC KERATOSIS - Stuck-on, waxy, tan-brown papules and/or plaques  - Benign-appearing - Discussed benign etiology and prognosis. - Observe - Call for any changes  Acrochordons (Skin Tags) - Fleshy, skin-colored pedunculated papules - Benign appearing.  - Observe. - If desired, they can be removed with an in office procedure that is not covered by insurance. - Please call the clinic if you notice any new or changing lesions.  - Recommend OTC skin tag removing products   ANDROGENETIC ALOPECIA (FEMALE PATTERN HAIR LOSS) Exam: Diffuse thinning of the crown and widening of the midline part with retention of the frontal hairline  Not at goal  Female Androgenic Alopecia is a chronic condition related to genetics and/or hormonal changes.  In women androgenetic alopecia is commonly associated with menopause but may occur any time after puberty.  It causes hair thinning primarily on the crown with widening of the part and temporal hairline recession.  Can use OTC  Rogaine (minoxidil) 5% solution/foam as directed.  Recommend starting medical grade hair supplements- Viviscal or Nutrafol - Recommend follow up with Heidi Leonard for formal hair loss evaluation  Long term medication management.  Patient is using long term (months to years) prescription medication  to control their dermatologic condition.  These medications require periodic monitoring to evaluate for efficacy and side effects and may require periodic laboratory monitoring.     Return for app with Heidi Leonard for hairloss. and a sep for skin tag removal.  I, Berwyn Lesches, Surg Tech III, am acting as scribe for RUFUS CHRISTELLA HOLY, MD.   Documentation: I have reviewed the above documentation for accuracy and completeness, and I agree with the above.  RUFUS CHRISTELLA HOLY, MD

## 2023-10-22 NOTE — Patient Instructions (Addendum)
 For areas treated with Liquid Nitrogen:  Keep clean with soap and water.  Apply Vaseline or Aquaphor twice daily.    SEBORRHEIC KERATOSIS - Stuck-on, waxy, tan-brown papules and/or plaques  - Benign-appearing - Discussed benign etiology and prognosis. - Observe - Call for any changes   Important Information  Due to recent changes in healthcare laws, you may see results of your pathology and/or laboratory studies on MyChart before the doctors have had a chance to review them. We understand that in some cases there may be results that are confusing or concerning to you. Please understand that not all results are received at the same time and often the doctors may need to interpret multiple results in order to provide you with the best plan of care or course of treatment. Therefore, we ask that you please give us  2 business days to thoroughly review all your results before contacting the office for clarification. Should we see a critical lab result, you will be contacted sooner.   If You Need Anything After Your Visit  If you have any questions or concerns for your doctor, please call our main line at [phone] If no one answers, please leave a voicemail as directed and we will return your call as soon as possible. Messages left after 4 pm will be answered the following business day.   You may also send us  a message via MyChart. We typically respond to MyChart messages within 1-2 business days.  For prescription refills, please ask your pharmacy to contact our office. Our fax number is [phone].  If you have an urgent issue when the clinic is closed that cannot wait until the next business day, you can page your doctor at the number below.    Please note that while we do our best to be available for urgent issues outside of office hours, we are not available 24/7.   If you have an urgent issue and are unable to reach us , you may choose to seek medical care at your  doctor's office, retail clinic, urgent care center, or emergency room.  If you have a medical emergency, please immediately call 911 or go to the emergency department. In the event of inclement weather, please call our main line at [phone] for an update on the status of any delays or closures.  Dermatology Medication Tips: Please keep the boxes that topical medications come in in order to help keep track of the instructions about where and how to use these. Pharmacies typically print the medication instructions only on the boxes and not directly on the medication tubes.   If your medication is too expensive, please contact our office at [phone] or send us  a message through MyChart.   We are unable to tell what your co-pay for medications will be in advance as this is different depending on your insurance coverage. However, we may be able to find a substitute medication at lower cost or fill out paperwork to get insurance to cover a needed medication.   If a prior authorization is required to get your medication covered by your insurance company, please allow us  1-2 business days to complete this process.  Drug prices often vary depending on where the prescription is filled and some pharmacies may offer cheaper prices.  The website www.goodrx.com contains coupons for medications through different pharmacies. The prices here do not account for what the cost may be with help from insurance (it may be cheaper with  your insurance), but the website can give you the price if you did not use any insurance.  - You can print the associated coupon and take it with your prescription to the pharmacy.  - You may also stop by our office during regular business hours and pick up a GoodRx coupon card.  - If you need your prescription sent electronically to a different pharmacy, notify our office through [facility] or by phone at [phone]    Skin Education :   I counseled the patient  regarding the following: Sun screen (SPF 30 or greater) should be applied during peak UV exposure (between 10am and 2pm) and reapplied after exercise or swimming.  The ABCDEs of melanoma were reviewed with the patient, and the importance of monthly self-examination of moles was emphasized. Should any moles change in shape or color, or itch, bleed or burn, pt will contact our office for evaluation sooner then their interval appointment.  Plan: Sunscreen Recommendations I recommended a broad spectrum sunscreen with a SPF of 30 or higher. I explained that SPF 30 sunscreens block approximately 97 percent of the sun's harmful rays. Sunscreens should be applied at least 15 minutes prior to expected sun exposure and then every 2 hours after that as long as sun exposure continues. If swimming or exercising sunscreen should be reapplied every 45 minutes to an hour after getting wet or sweating. One ounce, or the equivalent of a shot glass full of sunscreen, is adequate to protect the skin not covered by a bathing suit. I also recommended a lip balm with a sunscreen as well. Sun protective clothing can be used in lieu of sunscreen but must be worn the entire time you are exposed to the sun's rays.

## 2023-10-26 ENCOUNTER — Ambulatory Visit: Admitting: Family Medicine

## 2023-11-13 MED ORDER — FLUOXETINE HCL 20 MG PO CAPS: 20.0000 mg | ORAL_CAPSULE | Freq: Two times a day (BID) | ORAL | 3 refills | Status: DC

## 2023-11-13 NOTE — Addendum Note (Signed)
 Addended by: CANDISE ALEENE DEL on: 11/13/2023 12:09 PM   Modules accepted: Orders

## 2023-11-13 NOTE — Telephone Encounter (Signed)
 Okay, prescription sent

## 2023-12-19 ENCOUNTER — Other Ambulatory Visit: Payer: Self-pay | Admitting: Family Medicine

## 2023-12-24 ENCOUNTER — Other Ambulatory Visit: Payer: Self-pay | Admitting: Family Medicine

## 2023-12-27 ENCOUNTER — Other Ambulatory Visit: Payer: Self-pay | Admitting: Family Medicine

## 2024-01-10 ENCOUNTER — Other Ambulatory Visit: Payer: Self-pay | Admitting: Family Medicine

## 2024-01-11 NOTE — Telephone Encounter (Signed)
 Requesting: tramadol  Contract: 05/20/23 UDS: 05/20/23 Last Visit: 10/06/23 Next Visit: 6 mo f/u advised Last Refill: 08/26/23 (30,2)  Please Advise. Rx pending

## 2024-01-11 NOTE — Telephone Encounter (Signed)
 Prescription sent. Needs follow-up March 2026

## 2024-02-09 ENCOUNTER — Ambulatory Visit: Admitting: Family Medicine

## 2024-02-09 ENCOUNTER — Encounter: Payer: Self-pay | Admitting: Family Medicine

## 2024-02-09 VITALS — BP 100/66 | HR 88 | Temp 97.2°F | Ht 67.13 in | Wt 180.8 lb

## 2024-02-09 DIAGNOSIS — Z79899 Other long term (current) drug therapy: Secondary | ICD-10-CM | POA: Diagnosis not present

## 2024-02-09 DIAGNOSIS — F411 Generalized anxiety disorder: Secondary | ICD-10-CM | POA: Diagnosis not present

## 2024-02-09 DIAGNOSIS — F988 Other specified behavioral and emotional disorders with onset usually occurring in childhood and adolescence: Secondary | ICD-10-CM

## 2024-02-09 DIAGNOSIS — R051 Acute cough: Secondary | ICD-10-CM | POA: Diagnosis not present

## 2024-02-09 DIAGNOSIS — B349 Viral infection, unspecified: Secondary | ICD-10-CM

## 2024-02-09 MED ORDER — BENZONATATE 200 MG PO CAPS: 200.0000 mg | ORAL_CAPSULE | Freq: Three times a day (TID) | ORAL | 0 refills | Status: AC | PRN

## 2024-02-09 NOTE — Progress Notes (Signed)
 OFFICE VISIT  02/09/2024  CC:  Chief Complaint  Patient presents with   Cough    Onset 1 week ago; other symptoms (body aches and headache) started 2 weeks ago. Has tried using Alka-Seltzer cold and cough drops    Patient is a 58 y.o. female who presents for cough. I last saw her 4 months ago. A/P as of last visit: 1 recurrent major depressive disorder, full remission. Continue fluoxetine  (see #3 below).   2.  GAD, not well-controlled. She will start taking 2 of her 20 mg fluoxetine  capsules per day.  When she is finished with these she will call for the 40 mg capsule prescription. Additionally, it is okay for her to take a 0.5 mg alprazolam  in the morning as well as in the afternoon/evening regularly.  This seems to help her more than anything. I have been prescribing 20 of these pills at a time in the past.  When she calls for refill in the future we will go ahead and put #60 as the number to dispense for 1 month supply.   3 Fibromyalgia. Lyrica  150 mg twice daily and as needed tramadol  does seem to help her but she still deals with a lot of pain chronically.  She used to get massage therapy which helped a lot but her massage therapist moved away. She will research what integrative therapy in [place] has to offer and she will get back to me if she needs a referral.   #4 adult ADD. Doing well on Adderall 20 mg twice daily.   #5 weight management, overweight. Continue Wegovy  2.4 mg weekly.  HPI: About 10 to 14 days ago she started to feel little bit of fullness in the head as if she was congested but no mucus was present. Felt some malaise.  No fever.  Still had some mild bodyaches. Most of the symptoms resolved about a week later and then she started to get a nonproductive cough.  No shortness of breath, fever, postnasal drip, or sore throat.  She does have some headache.  No wheezing.  No chest pain.  She also notes that she does not think fluoxetine  is helping her any.   She felt like it was making her jittery as well.  She was taking 2 of the 20 mg capsules at 1 time in the daytime so she started splitting this up and taking 20 in the morning and 20 in the evening.   PMP AWARE reviewed today: most recent rx for lyrica  was filled 02/02/24, # 180, rx by me. Most recent tramadol  rx filled 01/11/24, #30, rx by me.  Most recent alpraz rx filled 01/10/24, #60, rx by me. No red flags.  Past Medical History:  Diagnosis Date   Adult ADHD 06/2017   DDD (degenerative disc disease), cervical 07/2018   VERY mild C5-6, right   GAD (generalized anxiety disorder)    with panic attacks   History of abnormal Pap smear    Repeats are always normal per pt (has GYN MD)   History of depression    Lexapro helped for a while, Pristique started 04/2011   Intercostal muscle tear 11/2013   R side   Migraines    Myofascial pain 2020   upper back/neck   Subclinical hyperthyroidism 2010   Some low TSHs and normal FT4 and T3   Vitamin D deficiency 11/2011    Past Surgical History:  Procedure Laterality Date   Unremarkable.      Outpatient Medications Prior to  Visit  Medication Sig Dispense Refill   ALPRAZolam  (XANAX ) 0.5 MG tablet 1-2 tabs p.o bid as needed for anxiety 60 tablet 3   amphetamine -dextroamphetamine  (ADDERALL) 20 MG tablet Take 1 tablet (20 mg total) by mouth 2 (two) times daily. 180 tablet 0   Multiple Vitamins-Calcium (ONE-A-DAY WOMENS PO) Take by mouth. Take 1 daily.     pregabalin  (LYRICA ) 150 MG capsule TAKE 1 CAPSULE BY MOUTH TWICE A DAY 180 capsule 1   semaglutide -weight management (WEGOVY ) 2.4 MG/0.75ML SOAJ SQ injection INJECT 2.4 MG INTO THE SKIN ONCE A WEEK. 3 mL 1   traMADol  (ULTRAM ) 50 MG tablet 1-2 TABLETS BY MOUTH TWICE DAILY AS NEEDED PAIN 30 tablet 3   valACYclovir  (VALTREX ) 1000 MG tablet TAKE 2 TABLETS BY MOUTH EVERY 12 HOURS X 2 DOSES AT ONSET OF OUTBREAK OF COLD SORES (Patient taking differently: as needed. TAKE 2 TABLETS BY MOUTH EVERY 12  HOURS X 2 DOSES AT ONSET OF OUTBREAK OF COLD SORES) 20 tablet 1   zolmitriptan  (ZOMIG ) 5 MG tablet TAKE 1 TAB AT ONSET OF MIGRAINE, MAY REPEAT DOSE IN 2 HOURS IF NEEDED. MAX IN 24H IS 10 MG. 8 tablet 6   FLUoxetine  (PROZAC ) 20 MG capsule TAKE 1 CAPSULE BY MOUTH EVERY DAY 90 capsule 0   No facility-administered medications prior to visit.    Allergies[1]  Review of Systems  As per HPI  PE:    02/09/2024   11:02 AM 10/22/2023    1:29 PM 10/06/2023    9:56 AM  Vitals with BMI  Height 5' 7.13  5' 7.13  Weight 180 lbs 13 oz  178 lbs  BMI 28.21  27.77  Systolic 100 103 894  Diastolic 66 70 72  Pulse 88 92 87     Physical Exam  Gen: Alert, well appearing.  Patient is oriented to person, place, time, and situation. ZWU:Zbzd: no injection, icteris, swelling, or exudate.  EOMI, PERRLA. Mouth: lips without lesion/swelling.  Oral mucosa pink and moist. Oropharynx without erythema, exudate, or swelling.  Neck: no LAD CV: RRR, no m/r/g.   LUNGS: CTA bilat, nonlabored resps, good aeration in all lung fields.   LABS:  Last metabolic panel Lab Results  Component Value Date   GLUCOSE 88 10/06/2023   NA 140 10/06/2023   K 4.5 10/06/2023   CL 102 10/06/2023   CO2 30 10/06/2023   BUN 13 10/06/2023   CREATININE 0.70 10/06/2023   GFR 97.09 10/06/2023   CALCIUM 9.1 10/06/2023   PROT 6.4 10/06/2023   ALBUMIN 4.0 10/06/2023   BILITOT 0.9 10/06/2023   ALKPHOS 71 10/06/2023   AST 19 10/06/2023   ALT 38 (H) 10/06/2023   IMPRESSION AND PLAN:  #1 viral syndrome, cough. I think she is starting to get over this in the last day or 2. Tessalon Perles 200 mg, 1 tab 3 times daily as needed cough. She can stop her Mucinex now.  #2 GAD. Increasing fluoxetine  has not helped her. Will gradually wean off of this over the next 2 weeks---> one of the 20 mg capsules once a day for 7 days then 1 capsule every other day for 5 doses, then stop. She will continue on alprazolam  0.5 mg tabs, with  1-2 twice daily as needed.  3 Fibromyalgia. Lyrica  150 mg twice daily and as needed tramadol  does seem to help her but she still deals with a lot of pain chronically.  Tramadol  50 mg, 1-2 twice daily as needed. a new prescription was  not needed today.    #4 adult ADD. Doing well on Adderall 20 mg twice daily.   #5 weight management, overweight. Continue Wegovy  2.4 mg weekly.  An After Visit Summary was printed and given to the patient.  FOLLOW UP: Return in about 6 months (around 08/09/2024) for routine chronic illness f/u. Next cpe Aug/sept 2026 Signed:  Gerlene Hockey, MD           02/09/2024     [1] No Known Allergies

## 2024-02-09 NOTE — Patient Instructions (Signed)
 Take one prozac  (fluoxetine ) cap once a day for 7d then 1 cap every other day for 5 doses, then stop

## 2024-02-26 ENCOUNTER — Other Ambulatory Visit: Payer: Self-pay | Admitting: Family Medicine

## 2024-03-16 ENCOUNTER — Ambulatory Visit: Admitting: Dermatology
